# Patient Record
Sex: Male | Born: 1970 | Race: Black or African American | Hispanic: No | Marital: Single | Smoking: Current some day smoker
Health system: Southern US, Community
[De-identification: ages and names within clinical notes are randomized; demographics above are authoritative.]

## PROBLEM LIST (undated history)

## (undated) ENCOUNTER — Emergency Department (HOSPITAL_COMMUNITY): Admission: EM | Payer: Self-pay | Source: Home / Self Care

## (undated) DIAGNOSIS — G8929 Other chronic pain: Secondary | ICD-10-CM

## (undated) DIAGNOSIS — R079 Chest pain, unspecified: Secondary | ICD-10-CM

## (undated) DIAGNOSIS — M79606 Pain in leg, unspecified: Secondary | ICD-10-CM

## (undated) DIAGNOSIS — R002 Palpitations: Secondary | ICD-10-CM

## (undated) DIAGNOSIS — M549 Dorsalgia, unspecified: Secondary | ICD-10-CM

## (undated) DIAGNOSIS — F419 Anxiety disorder, unspecified: Secondary | ICD-10-CM

## (undated) HISTORY — DX: Anxiety disorder, unspecified: F41.9

## (undated) HISTORY — DX: Chest pain, unspecified: R07.9

## (undated) HISTORY — DX: Palpitations: R00.2

---

## 2007-02-05 ENCOUNTER — Emergency Department (HOSPITAL_COMMUNITY): Admission: EM | Admit: 2007-02-05 | Discharge: 2007-02-05 | Payer: Self-pay | Admitting: Family Medicine

## 2008-09-21 ENCOUNTER — Emergency Department (HOSPITAL_COMMUNITY): Admission: EM | Admit: 2008-09-21 | Discharge: 2008-09-21 | Payer: Self-pay | Admitting: Family Medicine

## 2008-11-10 ENCOUNTER — Emergency Department (HOSPITAL_COMMUNITY): Admission: EM | Admit: 2008-11-10 | Discharge: 2008-11-10 | Payer: Self-pay | Admitting: Family Medicine

## 2009-06-16 ENCOUNTER — Emergency Department (HOSPITAL_COMMUNITY): Admission: EM | Admit: 2009-06-16 | Discharge: 2009-06-16 | Payer: Self-pay | Admitting: Emergency Medicine

## 2010-10-05 LAB — INFLUENZA A AND B ANTIGEN (CONVERTED LAB): Inflenza A Ag: POSITIVE — AB

## 2011-08-22 ENCOUNTER — Emergency Department (HOSPITAL_COMMUNITY)
Admission: EM | Admit: 2011-08-22 | Discharge: 2011-08-22 | Disposition: A | Discharge status: Home / Self Care | Payer: Commercial Indemnity | Attending: Emergency Medicine | Admitting: Emergency Medicine

## 2011-08-22 ENCOUNTER — Encounter (HOSPITAL_COMMUNITY): Payer: Self-pay | Admitting: Emergency Medicine

## 2011-08-22 DIAGNOSIS — F17200 Nicotine dependence, unspecified, uncomplicated: Secondary | ICD-10-CM | POA: Insufficient documentation

## 2011-08-22 DIAGNOSIS — B349 Viral infection, unspecified: Secondary | ICD-10-CM

## 2011-08-22 DIAGNOSIS — B9789 Other viral agents as the cause of diseases classified elsewhere: Secondary | ICD-10-CM | POA: Insufficient documentation

## 2011-08-22 LAB — URINALYSIS, ROUTINE W REFLEX MICROSCOPIC
Glucose, UA: NEGATIVE mg/dL
Hgb urine dipstick: NEGATIVE
Protein, ur: 30 mg/dL — AB
Urobilinogen, UA: 1 mg/dL (ref 0.0–1.0)

## 2011-08-22 LAB — URINE MICROSCOPIC-ADD ON

## 2011-08-22 NOTE — ED Notes (Signed)
Patient complaining chills and diaphoresis since yesterday evening; patient denies any shortness of breath, dizziness, weakness, chest pain, nausea/vomiting, and recent cold symptoms.  Patient denies fever.

## 2011-08-22 NOTE — ED Provider Notes (Signed)
Medical screening examination/treatment/procedure(s) were performed by non-physician practitioner and as supervising physician I was immediately available for consultation/collaboration.   Glynn Octave, MD 08/22/11 [phone]

## 2011-08-22 NOTE — ED Provider Notes (Signed)
History     CSN: [id number]  Arrival date & time 08/22/11  1928   First MD Initiated Contact with Patient 08/22/11 2110      Chief Complaint  Patient presents with  . Chills    (Consider location/radiation/quality/duration/timing/severity/associated sxs/prior treatment) HPI Comments: Patient very vague stating, that he has diaphoresis, and chills.  Started yesterday without any other complaints.  No nausea, vomiting, diarrhea, sore throat, runny nose, ear pain, seasonal allergies, urinary tract symptoms, penile discharge.  He, states he would took [facility] powders, and felt better  The history is provided by the patient.    History reviewed. No pertinent past medical history.  History reviewed. No pertinent past surgical history.  History reviewed. No pertinent family history.  History  Substance Use Topics  . Smoking status: Current Everyday Smoker  . Smokeless tobacco: Not on file  . Alcohol Use: Yes      Review of Systems  Constitutional: Positive for chills. Negative for fever and appetite change.  HENT: Negative for ear pain, congestion, sore throat, rhinorrhea and trouble swallowing.   Respiratory: Negative for cough and shortness of breath.   Cardiovascular: Negative for chest pain.  Gastrointestinal: Negative for nausea, vomiting, abdominal pain, diarrhea and constipation.  Genitourinary: Negative for dysuria, frequency, flank pain and discharge.  Neurological: Negative for dizziness, weakness and headaches.    Allergies  Review of patient's allergies indicates no known allergies.  Home Medications   Current Outpatient Rx  Name Route Sig Dispense Refill  . BC HEADACHE POWDER PO Oral Take 1 packet by mouth as needed. For pain or fever      BP 131/74  Pulse 65  Temp 99.2 F (37.3 C) (Oral)  Resp 16  SpO2 98%  Physical Exam  Constitutional: He is oriented to person, place, and time. He appears well-developed and well-nourished.  HENT:  Head:  Normocephalic.  Right Ear: External ear normal.  Left Ear: External ear normal.  Nose: Nose normal.  Mouth/Throat: Oropharynx is clear and moist.  Eyes: Pupils are equal, round, and reactive to light.  Cardiovascular: Normal rate.   Pulmonary/Chest: Effort normal. He has no wheezes. He exhibits no tenderness.  Abdominal: Soft. He exhibits no distension. There is no tenderness.  Musculoskeletal: Normal range of motion.  Neurological: He is alert and oriented to person, place, and time.  Skin: Skin is warm. No rash noted. No pallor.    ED Course  Procedures (including critical care time)  Labs Reviewed  URINALYSIS, ROUTINE W REFLEX MICROSCOPIC - Abnormal; Notable for the following:    Color, Urine AMBER (*)  BIOCHEMICALS MAY BE AFFECTED BY COLOR   APPearance CLOUDY (*)     Bilirubin Urine SMALL (*)     Ketones, ur 15 (*)     Protein, ur 30 (*)     All other components within normal limits  URINE MICROSCOPIC-ADD ON   No results found.   1. Viral disease       MDM   Urine results reviewed.  There isindication of infection.  Patient's symptoms are consistent with a viral syndrome, and encouraged him to take over-the-counter Tylenol, ibuprofen, drink plenty of fluids and        Arman Filter, NP 08/22/11 2300

## 2011-12-30 ENCOUNTER — Ambulatory Visit (INDEPENDENT_AMBULATORY_CARE_PROVIDER_SITE_OTHER): Payer: Managed Care, Other (non HMO) | Admitting: Internal Medicine

## 2011-12-30 VITALS — BP 110/78 | HR 71 | Temp 99.1°F | Resp 18 | Ht 73.0 in | Wt 203.0 lb

## 2011-12-30 DIAGNOSIS — R509 Fever, unspecified: Secondary | ICD-10-CM

## 2011-12-30 LAB — POCT CBC
HCT, POC: 50.3 % (ref 43.5–53.7)
Lymph, poc: 1.3 (ref 0.6–3.4)
MCH, POC: 29 pg (ref 27–31.2)
MCHC: 30.6 g/dL — AB (ref 31.8–35.4)
POC Granulocyte: 1.7 — AB (ref 2–6.9)
POC LYMPH PERCENT: 35.1 %L (ref 10–50)
POC MID %: 17.3 %M — AB (ref 0–12)
RDW, POC: 13.6 %
WBC: 3.6 10*3/uL — AB (ref 4.6–10.2)

## 2011-12-30 NOTE — Patient Instructions (Addendum)
Fluids/advance diet/tylenol for fever

## 2011-12-30 NOTE — Progress Notes (Signed)
  Subjective:    Patient ID: Andrew Mcbride, male    DOB: 25-Feb-1970, 41 y.o.   MRN: [id number]  HPI three-day history of headache body aches cough and congestion. Fever as high as 102.3 No nausea vomiting or diarrhea. No flu shot  Past medical history no illnesses no medications Smoker Review of Systems     Objective:   Physical Exam Vital signs stable with T. of 99.1 HEENT is clear No anterior cervical nodes Lungs clear to auscultation Abdomen benign No rash   Results for orders placed in visit on 12/30/11  POCT CBC      Component Value Range   WBC 3.6 (*) 4.6 - 10.2 K/uL   Lymph, poc 1.3  0.6 - 3.4   POC LYMPH PERCENT 35.1  10 - 50 %L   MID (cbc) 0.6  0 - 0.9   POC MID % 17.3 (*) 0 - 12 %M   POC Granulocyte 1.7 (*) 2 - 6.9   Granulocyte percent 47.6  37 - 80 %G   RBC 5.31  4.69 - 6.13 M/uL   Hemoglobin 15.4  14.1 - 18.1 g/dL   HCT, POC 78.2  95.6 - 53.7 %   MCV 94.8  80 - 97 fL   MCH, POC 29.0  27 - 31.2 pg   MCHC 30.6 (*) 31.8 - 35.4 g/dL   RDW, POC 21.3     Platelet Count, POC 146  142 - 424 K/uL   MPV 10.0  0 - 99.8 fL       Assessment & Plan:  Problem #1 viral syndrome probably influenza  Fluids Tylenol and bed rest Out of work until fever resolved

## 2012-08-26 ENCOUNTER — Ambulatory Visit (INDEPENDENT_AMBULATORY_CARE_PROVIDER_SITE_OTHER): Payer: Managed Care, Other (non HMO) | Admitting: Physician Assistant

## 2012-08-26 DIAGNOSIS — T6391XA Toxic effect of contact with unspecified venomous animal, accidental (unintentional), initial encounter: Secondary | ICD-10-CM

## 2012-08-26 MED ORDER — PREDNISONE 20 MG PO TABS: ORAL_TABLET | ORAL | Status: DC

## 2012-08-26 MED ORDER — METHYLPREDNISOLONE SODIUM SUCC 125 MG IJ SOLR
125.0000 mg | Freq: Once | INTRAMUSCULAR | Status: AC
  Administered 2012-08-26: 125 mg via INTRAMUSCULAR

## 2012-08-26 NOTE — Patient Instructions (Addendum)
Start prednisone taper tomorrow morning Also recommend Zyrtec (cetirizine) daily in the morning and Benadryl 25-50 mg at bedtime Ice for 20 minutes 2-3 times daily and elevate as much as possible.  Return in 48 hours if not significantly improved, sooner if worse     Insect Sting Allergy An insect sting can cause pain, redness, and itching at the sting site. Symptoms of an allergic reaction are usually contained in the area of the sting site (localized). An allergic reaction usually occurs within minutes of an insect sting. Redness and swelling of the sting site may last as long as 1 week. SYMPTOMS   A local reaction at the sting site can cause:  Pain.  Redness.  Itching.  Swelling.  A systemic reaction can cause a reaction anywhere on your body. For example, you may develop the following:  Hives.  Generalized swelling.  Body aches.  Itching.  Dizziness.  Nausea or vomiting.  A more serious (anaphylactic) reaction can involve:  Difficulty breathing or wheezing.  Tongue or throat swelling.  Fainting. HOME CARE INSTRUCTIONS   If you are stung, look to see if the stinger is still in the skin. This can appear as a small, black dot at the sting site. The stinger can be removed by scraping it with a dull object such as a credit card or your fingernail. Do not use tweezers. Tweezers can squeeze the stinger and release more insect venom into the skin.  After the stinger has been removed, wash the sting site with soap and water or rubbing alcohol.  Put ice on the sting area.  Put ice in a plastic bag.  Place a towel between your skin and the bag.  Leave the ice on for 15-20 minutes, 3-4 times a day.  You can use a topical anti-itch cream, such as hydrocortisone cream, to help reduce itching.  You can take an oral antihistamine medicine to help decrease swelling and other symptoms.  Only take over-the-counter or prescription medicines for pain, discomfort, or fever  as directed by your caregiver.  If prescribed, keep an epinephrine injection to temporarily treat emergency allergic reactions with you at all times. It is important to know how and when to give an epinephrine injection.  Avoid contact with stinging insects or the insect thought to have caused your reaction.  Wear long pants when mowing grass or hiking. Wear gloves when gardening.  Use unscented deodorant and avoid strong perfumes when outdoors.  Wear a medical alert bracelet or necklace that describes your allergies.  Make sure your primary caregiver has a record of your insect sting reaction.  It may be helpful to consult with an allergy specialist. You may have other sensitivities that you are not aware of. SEEK IMMEDIATE MEDICAL CARE IF:  You experience wheezing or difficulty breathing.  You have difficulty swallowing, or you develop throat tightness.  You have mouth, tongue, or throat swelling.  You feel weak, or you faint.  You have coughing or a change in your voice.  You experience vomiting, diarrhea, or stomach cramps.  You have chest pain or lightheadedness.  You notice raised, red patches on the skin that itch. These may be early warning signs of a serious generalized or anaphylactic reaction. Call your local emergency services (911 in [place] immediately. MAKE SURE YOU:   Understand these instructions.  Will watch your condition.  Will get help right away if you are not doing well or get worse. FOR MORE INFORMATION American Academy of Allergy Asthma  and Immunology: www.aaaai.org [employer], [employer]: www.acaai.org Document Released: 11/29/2005 Document Revised: 03/25/2011 Document Reviewed: 01/10/2009 [facility] Patient Information 2014 [place], [place].

## 2012-08-26 NOTE — Progress Notes (Signed)
  Subjective:    Patient ID: Andrew Mcbride, male    DOB: 1970-09-17, 42 y.o.   MRN: [id number]  HPI 42 year old male presents with 3 day history of left arm swelling and pruritis s/p insect sting on 08/24/12 while at work. He works for the [employer] and was opening a trash bin and bees flew out stinging him on his left arm.  Admits he felt immediate pain and noticed a bit of local swelling.  Since then it has become increasingly swollen and irritated. States it is extremely pruritic.  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 Admits this did not seem to help at all.  Denies SOB, trouble breathing, lip/tongue swelling.  He has not tried any other OTC medications. Denies purulent drainage, fever, chills, nausea, vomiting, or headache. Otherwise is doing well with no other concerns today     Review of Systems  Constitutional: Negative for fever and chills.  Gastrointestinal: Negative for nausea and vomiting.  Skin: Positive for rash (swelling of left forearm).  Neurological: Negative for headaches.       Objective:   Physical Exam  Constitutional: He is oriented to person, place, and time. He appears well-developed and well-nourished.  HENT:  Head: Normocephalic and atraumatic.  Right Ear: External ear normal.  Left Ear: External ear normal.  Eyes: Conjunctivae are normal.  Neck: Normal range of motion.  Cardiovascular: Normal rate.   Pulmonary/Chest: Effort normal.  Neurological: He is alert and oriented to person, place, and time.  Skin:     Psychiatric: He has a normal mood and affect. His behavior is normal. Judgment and thought content normal.          Assessment & Plan:  Bee sting reaction, initial encounter - Plan: methylPREDNISolone sodium succinate (SOLU-MEDROL) 125 mg/2 mL injection 125 mg  Solumedrol 125 mg given today Start prednisone taper tomorrow Zyrtec daily in the morning, Benadryl at bedtime Ice for 20 minutes 2-3  times a day and elevate as much as possible Out of work today and tomorrow Follow up if symptoms do not improve in 48 hours, sooner if worse

## 2013-10-04 ENCOUNTER — Ambulatory Visit (INDEPENDENT_AMBULATORY_CARE_PROVIDER_SITE_OTHER): Payer: Managed Care, Other (non HMO) | Admitting: Internal Medicine

## 2013-10-04 VITALS — BP 108/64 | HR 68 | Temp 98.2°F | Resp 16 | Ht 70.5 in | Wt 206.0 lb

## 2013-10-04 DIAGNOSIS — M79672 Pain in left foot: Secondary | ICD-10-CM

## 2013-10-04 DIAGNOSIS — M79605 Pain in left leg: Secondary | ICD-10-CM

## 2013-10-04 DIAGNOSIS — B070 Plantar wart: Secondary | ICD-10-CM

## 2013-10-04 DIAGNOSIS — M79609 Pain in unspecified limb: Secondary | ICD-10-CM

## 2013-10-04 DIAGNOSIS — G8929 Other chronic pain: Secondary | ICD-10-CM

## 2013-10-04 MED ORDER — MELOXICAM 15 MG PO TABS: 15.0000 mg | ORAL_TABLET | Freq: Every day | ORAL | Status: DC

## 2013-10-04 NOTE — Progress Notes (Signed)
Subjective:  This chart was scribed for Ellamae Sia, MD by Elveria Rising, Medial Scribe. This patient was seen in room 8 and the patient's care was started at 6:36 PM.    Patient ID: Andrew Mcbride, male    DOB: 1970-06-21, 43 y.o.   MRN: [id number]  Chief Complaint  Patient presents with  . Foot Pain    left, radiates up leg    HPI HPI Comments: Andrew Mcbride is a 43 y.o. male who presents to the [facility] complaining of pain in his left lower leg, for quite a while. Patient went to the podiatrist today and had several plantar, calcaneal warts removed. Patient describes burning sensation in his left leg and calf tightness, but denies numbness and tingling. Patient has been walking on his tip toes to prevent bearing weight on his heel and irritating his warts. Patient reports exacerbation with ambulation and reports aching pain at night even when he is at rest. 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 He denies knee involvement or associated back pain. Patient reports history of gout, diagnosed several years ago, but he denies redness or pain with applied pressure to his lower leg.   There are no active problems to display for this patient.  History reviewed. No pertinent past medical history. History reviewed. No pertinent past surgical history. No Known Allergies Prior to Admission medications   Medication Sig Start Date End Date Taking? Authorizing Provider  Aspirin-Salicylamide-Caffeine (BC HEADACHE POWDER PO) Take 1 packet by mouth as needed. For pain or fever   Yes Historical Provider, MD   History   Social History  . Marital Status: Single    Spouse Name: N/A    Number of Children: N/A  . Years of Education: N/A   Occupational History  . Not on file.   Social History Main Topics  . Smoking status: Current Every Day Smoker  . Smokeless tobacco: Not on file  . Alcohol Use: Yes  . Drug Use: No  . Sexual Activity:    Other  Topics Concern  . Not on file   Social History Narrative  . No narrative on file     Review of Systems  Constitutional: Negative for fever and chills.  Musculoskeletal: Positive for arthralgias. Negative for back pain, gait problem and joint swelling.  Neurological: Negative for weakness and numbness.       Objective:   Physical Exam  Nursing note and vitals reviewed. Constitutional: He is oriented to person, place, and time. He appears well-developed and well-nourished. No distress.  HENT:  Head: Normocephalic and atraumatic.  Eyes: EOM are normal.  Neck: Neck supple.  Cardiovascular: Normal rate.   Pulmonary/Chest: Effort normal. No respiratory distress.  Musculoskeletal: Normal range of motion. He exhibits tenderness.  Left leg: has no swelling or tenderness to palpation or ROM except for the distal part of the calf. No defect or mass. The left heel has multiple warts which have been operated on today by podiatry.   Neurological: He is alert and oriented to person, place, and time.  Skin: Skin is warm and dry.  Psychiatric: He has a normal mood and affect. His behavior is normal.     Filed Vitals:   10/04/13 1803  BP: 108/64  Pulse: 68  Temp: 98.2 F (36.8 C)  Resp: 16  Height: 5' 10.5" (1.791 m)  Weight: 206 lb (93.441 kg)  SpO2: 99%        Assessment & Plan:  Pain of left leg-gastroc str due to altered gait  Pain, foot, chronic, left due to Plantar warts/surgery today  mobic Heel lift molesk  I personally performed the services described in this documentation, which was scribed in my presence. The recorded information has been reviewed and is accurate.

## 2014-01-23 ENCOUNTER — Emergency Department (HOSPITAL_COMMUNITY)
Admission: EM | Admit: 2014-01-23 | Discharge: 2014-01-23 | Disposition: A | Discharge status: Home / Self Care | Payer: Commercial Indemnity | Attending: Emergency Medicine | Admitting: Emergency Medicine

## 2014-01-23 ENCOUNTER — Encounter (HOSPITAL_COMMUNITY): Payer: Self-pay | Admitting: Emergency Medicine

## 2014-01-23 DIAGNOSIS — Y9389 Activity, other specified: Secondary | ICD-10-CM | POA: Insufficient documentation

## 2014-01-23 DIAGNOSIS — Y998 Other external cause status: Secondary | ICD-10-CM | POA: Insufficient documentation

## 2014-01-23 DIAGNOSIS — Z720 Tobacco use: Secondary | ICD-10-CM | POA: Insufficient documentation

## 2014-01-23 DIAGNOSIS — Y9289 Other specified places as the place of occurrence of the external cause: Secondary | ICD-10-CM | POA: Diagnosis not present

## 2014-01-23 DIAGNOSIS — X58XXXA Exposure to other specified factors, initial encounter: Secondary | ICD-10-CM | POA: Insufficient documentation

## 2014-01-23 DIAGNOSIS — Z791 Long term (current) use of non-steroidal anti-inflammatories (NSAID): Secondary | ICD-10-CM | POA: Diagnosis not present

## 2014-01-23 DIAGNOSIS — S3992XA Unspecified injury of lower back, initial encounter: Secondary | ICD-10-CM | POA: Diagnosis present

## 2014-01-23 DIAGNOSIS — S39002A Unspecified injury of muscle, fascia and tendon of lower back, initial encounter: Secondary | ICD-10-CM | POA: Diagnosis not present

## 2014-01-23 DIAGNOSIS — T148XXA Other injury of unspecified body region, initial encounter: Secondary | ICD-10-CM

## 2014-01-23 MED ORDER — METHOCARBAMOL 750 MG PO TABS: 750.0000 mg | ORAL_TABLET | Freq: Four times a day (QID) | ORAL | Status: DC

## 2014-01-23 MED ORDER — IBUPROFEN 600 MG PO TABS: 600.0000 mg | ORAL_TABLET | Freq: Four times a day (QID) | ORAL | Status: DC | PRN

## 2014-01-23 NOTE — ED Notes (Signed)
Patient here with complaint of lower back pain. States that it started about 1 hour ago. Denies history of the same. 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 Concerned about back injury.

## 2014-01-23 NOTE — Discharge Instructions (Signed)

## 2014-01-23 NOTE — ED Provider Notes (Signed)
CSN: [id number]     Arrival date & time 01/23/14  0223 History   First MD Initiated Contact with Patient 01/23/14 0413     Chief Complaint  Patient presents with  . Back Pain     (Consider location/radiation/quality/duration/timing/severity/associated sxs/prior Treatment) HPI Comments: Patient here complains of 2 months of left-sided back pain is worse with movement and characterized as sharp. Pain does not radiate to his leg. No associated bowel or bladder dysfunction. No saddle anesthesias. Denies any rashes to the area. Symptoms are better with remaining still. No trouble walking. Does do heavy lifting. No treatment use prior to arrival  Patient is a 44 y.o. male presenting with back pain. The history is provided by the patient.  Back Pain   History reviewed. No pertinent past medical history. History reviewed. No pertinent past surgical history. No family history on file. History  Substance Use Topics  . Smoking status: Current Every Day Smoker  . Smokeless tobacco: Not on file  . Alcohol Use: Yes    Review of Systems  Musculoskeletal: Positive for back pain.  All other systems reviewed and are negative.     Allergies  Review of patient's allergies indicates no known allergies.  Home Medications   Prior to Admission medications   Medication Sig Start Date End Date Taking? Authorizing Provider  Aspirin-Salicylamide-Caffeine (BC HEADACHE POWDER PO) Take 1 packet by mouth as needed. For pain or fever    Historical Provider, MD  meloxicam (MOBIC) 15 MG tablet Take 1 tablet (15 mg total) by mouth daily. 10/04/13   Tonye Pearson, MD   BP 137/77 mmHg  Pulse 98  Temp(Src) 98.4 F (36.9 C) (Oral)  Resp 18  Ht 6' (1.829 m)  Wt 205 lb (92.987 kg)  BMI 27.80 kg/m2  SpO2 98% Physical Exam  Constitutional: He is oriented to person, place, and time. He appears well-developed and well-nourished.  Non-toxic appearance. No distress.  HENT:  Head: Normocephalic and  atraumatic.  Eyes: Conjunctivae, EOM and lids are normal. Pupils are equal, round, and reactive to light.  Neck: Normal range of motion. Neck supple. No tracheal deviation present. No thyroid mass present.  Cardiovascular: Normal rate, regular rhythm and normal heart sounds.  Exam reveals no gallop.   No murmur heard. Pulmonary/Chest: Effort normal and breath sounds normal. No stridor. No respiratory distress. He has no decreased breath sounds. He has no wheezes. He has no rhonchi. He has no rales.  Abdominal: Soft. Normal appearance and bowel sounds are normal. He exhibits no distension. There is no tenderness. There is no rebound and no CVA tenderness.  Musculoskeletal: Normal range of motion. He exhibits no edema or tenderness.       Back:  Neurological: He is alert and oriented to person, place, and time. He has normal strength. No cranial nerve deficit or sensory deficit. GCS eye subscore is 4. GCS verbal subscore is 5. GCS motor subscore is 6.  Skin: Skin is warm and dry. No abrasion and no rash noted.  Psychiatric: He has a normal mood and affect. His speech is normal and behavior is normal.  Nursing note and vitals reviewed.   ED Course  Procedures (including critical care time) Labs Review Labs Reviewed - No data to display  Imaging Review No results found.   EKG Interpretation None      MDM   Final diagnoses:  None    Patient nontender along his thoracic and lumbar spine. Suspect musculoskeletal etiology of symptoms. Will place  on medications and stable for discharge.    Toy BakerAnthony T Verlan Grotz, MD 01/23/14 [phone]

## 2014-01-23 NOTE — ED Notes (Signed)
Pt. Left with al belongings and refused wheelchair

## 2014-01-27 ENCOUNTER — Encounter (HOSPITAL_COMMUNITY): Payer: Self-pay | Admitting: Emergency Medicine

## 2014-01-27 ENCOUNTER — Emergency Department (HOSPITAL_COMMUNITY): Payer: Commercial Indemnity

## 2014-01-27 ENCOUNTER — Emergency Department (HOSPITAL_COMMUNITY)
Admission: EM | Admit: 2014-01-27 | Discharge: 2014-01-27 | Disposition: A | Discharge status: Home / Self Care | Payer: Commercial Indemnity | Attending: Emergency Medicine | Admitting: Emergency Medicine

## 2014-01-27 DIAGNOSIS — R251 Tremor, unspecified: Secondary | ICD-10-CM | POA: Diagnosis not present

## 2014-01-27 DIAGNOSIS — Z720 Tobacco use: Secondary | ICD-10-CM | POA: Diagnosis not present

## 2014-01-27 DIAGNOSIS — Z791 Long term (current) use of non-steroidal anti-inflammatories (NSAID): Secondary | ICD-10-CM | POA: Diagnosis not present

## 2014-01-27 DIAGNOSIS — Z79899 Other long term (current) drug therapy: Secondary | ICD-10-CM | POA: Insufficient documentation

## 2014-01-27 DIAGNOSIS — R52 Pain, unspecified: Secondary | ICD-10-CM

## 2014-01-27 DIAGNOSIS — M545 Low back pain, unspecified: Secondary | ICD-10-CM

## 2014-01-27 DIAGNOSIS — R079 Chest pain, unspecified: Secondary | ICD-10-CM | POA: Diagnosis not present

## 2014-01-27 LAB — CBC
HCT: 46.1 % (ref 39.0–52.0)
Hemoglobin: 15.8 g/dL (ref 13.0–17.0)
MCH: 30.8 pg (ref 26.0–34.0)
MCHC: 34.3 g/dL (ref 30.0–36.0)
MCV: 89.9 fL (ref 78.0–100.0)
Platelets: 192 10*3/uL (ref 150–400)
RBC: 5.13 MIL/uL (ref 4.22–5.81)
RDW: 12.7 % (ref 11.5–15.5)
WBC: 7.5 10*3/uL (ref 4.0–10.5)

## 2014-01-27 LAB — BASIC METABOLIC PANEL
Anion gap: 16 — ABNORMAL HIGH (ref 5–15)
BUN: 17 mg/dL (ref 6–23)
CO2: 26 mmol/L (ref 19–32)
Calcium: 9.6 mg/dL (ref 8.4–10.5)
Chloride: 100 mEq/L (ref 96–112)
Creatinine, Ser: 1.29 mg/dL (ref 0.50–1.35)
GFR calc Af Amer: 78 mL/min — ABNORMAL LOW (ref >90)
GFR calc non Af Amer: 67 mL/min — ABNORMAL LOW (ref >90)
Glucose, Bld: 139 mg/dL — ABNORMAL HIGH (ref 70–99)
Potassium: 3.5 mmol/L (ref 3.5–5.1)
Sodium: 142 mmol/L (ref 135–145)

## 2014-01-27 LAB — I-STAT TROPONIN, ED: TROPONIN I, POC: 0 ng/mL (ref 0.00–0.08)

## 2014-01-27 LAB — BRAIN NATRIURETIC PEPTIDE: B Natriuretic Peptide: 24.8 pg/mL (ref 0.0–100.0)

## 2014-01-27 MED ORDER — HYDROCODONE-ACETAMINOPHEN 5-325 MG PO TABS: 1.0000 | ORAL_TABLET | ORAL | Status: DC | PRN

## 2014-01-27 NOTE — Discharge Instructions (Signed)
Do not drive or work when using the pain medication. Try using heat on the sore area 3 or 4 times a day.    Back Pain, Adult Low back pain is very common. About 1 in 5 people have back pain.The cause of low back pain is rarely dangerous. The pain often gets better over time.About half of people with a sudden onset of back pain feel better in just 2 weeks. About 8 in 10 people feel better by 6 weeks.  CAUSES Some common causes of back pain include:  Strain of the muscles or ligaments supporting the spine.  Wear and tear (degeneration) of the spinal discs.  Arthritis.  Direct injury to the back. DIAGNOSIS Most of the time, the direct cause of low back pain is not known.However, back pain can be treated effectively even when the exact cause of the pain is unknown.Answering your caregiver's questions about your overall health and symptoms is one of the most accurate ways to make sure the cause of your pain is not dangerous. If your caregiver needs more information, he or she may order lab work or imaging tests (X-rays or MRIs).However, even if imaging tests show changes in your back, this usually does not require surgery. HOME CARE INSTRUCTIONS For many people, back pain returns.Since low back pain is rarely dangerous, it is often a condition that people can learn to [facility] their own.   Remain active. It is stressful on the back to sit or stand in one place. Do not sit, drive, or stand in one place for more than 30 minutes at a time. Take short walks on level surfaces as soon as pain allows.Try to increase the length of time you walk each day.  Do not stay in bed.Resting more than 1 or 2 days can delay your recovery.  Do not avoid exercise or work.Your body is made to move.It is not dangerous to be active, even though your back may hurt.Your back will likely heal faster if you return to being active before your pain is gone.  Pay attention to your body when you bend and lift.  Many people have less discomfortwhen lifting if they bend their knees, keep the load close to their bodies,and avoid twisting. Often, the most comfortable positions are those that put less stress on your recovering back.  Find a comfortable position to sleep. Use a firm mattress and lie on your side with your knees slightly bent. If you lie on your back, put a pillow under your knees.  Only take over-the-counter or prescription medicines as directed by your caregiver. Over-the-counter medicines to reduce pain and inflammation are often the most helpful.Your caregiver may prescribe muscle relaxant drugs.These medicines help dull your pain so you can more quickly return to your normal activities and healthy exercise.  Put ice on the injured area.  Put ice in a plastic bag.  Place a towel between your skin and the bag.  Leave the ice on for 15-20 minutes, 03-04 times a day for the first 2 to 3 days. After that, ice and heat may be alternated to reduce pain and spasms.  Ask your caregiver about trying back exercises and gentle massage. This may be of some benefit.  Avoid feeling anxious or stressed.Stress increases muscle tension and can worsen back pain.It is important to recognize when you are anxious or stressed and learn ways to manage it.Exercise is a great option. SEEK MEDICAL CARE IF:  You have pain that is not relieved with rest  or medicine.  You have pain that does not improve in 1 week.  You have new symptoms.  You are generally not feeling well. SEEK IMMEDIATE MEDICAL CARE IF:   You have pain that radiates from your back into your legs.  You develop new bowel or bladder control problems.  You have unusual weakness or numbness in your arms or legs.  You develop nausea or vomiting.  You develop abdominal pain.  You feel faint. Document Released: 12/31/2004 Document Revised: 07/02/2011 Document Reviewed: 05/04/2013 [facility] Patient Information 2015 [place].  This information is not intended to replace advice given to you by your health care provider. Make sure you discuss any questions you have with your health care provider.

## 2014-01-27 NOTE — ED Notes (Signed)
Pt. reports left lateral chest pain with mild SOB and chills onset this evening , no nausea or diaphoresis .

## 2014-01-27 NOTE — ED Provider Notes (Signed)
CSN: [id number]     Arrival date & time 01/27/14  0037 History  This chart was scribed for [name], MD by Luisa DagoPriscilla Tutu, ED Scribe. This patient was seen in room A01C/A01C and the patient's care was started at 2:01 AM.    Chief Complaint  Patient presents with  . Chest Pain    The history is provided by the patient and medical records. No language interpreter was used.   HPI Comments: Andrew Mcbride is a 44 y.o. male who presents to the Emergency Department complaining of gradual onset radiating worsening left sided back pain that started approximately 2 months ago. Pt was treated here in the ED for his back pain but he states that the medication has not proven effective in alleviating the back pain. He states hat the pain that he experienced today radiated up to his left chest. Although he complains of associated chest pain he is more concerned of the left sided back pain which he describes it being localized over the left iliac crest. He also admits to having tremors. Pt states that the pain is worsened by standing and certain body movements. Denies any recent falls, traumas, fever, chills, illicit drug use, nausea, emesis, or weakness.  History reviewed. No pertinent past medical history. History reviewed. No pertinent past surgical history. No family history on file. History  Substance Use Topics  . Smoking status: Current Every Day Smoker  . Smokeless tobacco: Not on file  . Alcohol Use: Yes    Review of Systems  Constitutional: Negative for fever and chills.  Respiratory: Negative for cough.   Cardiovascular: Positive for chest pain.  Gastrointestinal: Negative for nausea, vomiting and abdominal pain.  Musculoskeletal: Positive for back pain.  Neurological: Positive for tremors. Negative for weakness and numbness.  All other systems reviewed and are negative.     Allergies  Review of patient's allergies indicates no known allergies.  Home Medications   Prior to  Admission medications   Medication Sig Start Date End Date Taking? Authorizing Provider  Aspirin-Salicylamide-Caffeine (BC HEADACHE POWDER PO) Take 1 packet by mouth as needed. For pain or fever    Historical Provider, MD  HYDROcodone-acetaminophen (NORCO) 5-325 MG per tablet Take 1 tablet by mouth every 4 (four) hours as needed. 01/27/14   Flint MelterElliott L Sneijder Bernards, MD  ibuprofen (ADVIL,MOTRIN) 600 MG tablet Take 1 tablet (600 mg total) by mouth every 6 (six) hours as needed. 01/23/14   Toy BakerAnthony T Allen, MD  meloxicam (MOBIC) 15 MG tablet Take 1 tablet (15 mg total) by mouth daily. 10/04/13   Tonye Pearsonobert P Doolittle, MD  methocarbamol (ROBAXIN-750) 750 MG tablet Take 1 tablet (750 mg total) by mouth 4 (four) times daily. 01/23/14   Toy BakerAnthony T Allen, MD   BP 150/73 mmHg  Pulse 64  Temp(Src) 98.3 F (36.8 C) (Oral)  Resp 13  Ht 5\' 11"  (1.803 m)  Wt 202 lb (91.627 kg)  BMI 28.19 kg/m2  SpO2 99%  Physical Exam  Constitutional: He is oriented to person, place, and time. He appears well-developed and well-nourished.  HENT:  Head: Normocephalic and atraumatic.  Right Ear: External ear normal.  Left Ear: External ear normal.  Eyes: Conjunctivae and EOM are normal. Pupils are equal, round, and reactive to light.  Neck: Normal range of motion and phonation normal. Neck supple.  Cardiovascular: Normal rate, regular rhythm and normal heart sounds.   Pulmonary/Chest: Effort normal and breath sounds normal. He exhibits no bony tenderness.  Abdominal: Soft. There is  no tenderness.  Musculoskeletal: Normal range of motion.  Tenderness to palpation of the left iliac crest. Negative straight leg raises bilaterally. Normal ROM of all 4 extremities.  Neurological: He is alert and oriented to person, place, and time. No cranial nerve deficit or sensory deficit. He exhibits normal muscle tone. Coordination normal.  Skin: Skin is warm, dry and intact.  Psychiatric: He has a normal mood and affect. His behavior is normal.  Judgment and thought content normal.  Nursing note and vitals reviewed.   ED Course  Procedures (including critical care time)  DIAGNOSTIC STUDIES: Oxygen Saturation is 99% on RA, normal by my interpretation.    COORDINATION OF CARE: Medications - No data to display  No data found.  2:05 AM- Pt advised of plan for treatment and pt agrees.  At discharge- patient is much more comfortable.  Findings discussed with patient, all questions answered.  Labs Review Labs Reviewed  BASIC METABOLIC PANEL - Abnormal; Notable for the following:    Glucose, Bld 139 (*)    GFR calc non Af Amer 67 (*)    GFR calc Af Amer 78 (*)    Anion gap 16 (*)    All other components within normal limits  CBC  BRAIN NATRIURETIC PEPTIDE  I-STAT TROPOININ, ED    Imaging Review Dg Chest 2 View  01/27/2014   CLINICAL DATA:  Chest pain.  EXAM: CHEST  2 VIEW  COMPARISON:  None.  FINDINGS: The cardiomediastinal contours are normal. The lungs are clear. Pulmonary vasculature is normal. No consolidation, pleural effusion, or pneumothorax. No acute osseous abnormalities are seen.  IMPRESSION: No acute pulmonary process.   Electronically Signed   By: Rubye Oaks M.D.   On: 01/27/2014 02:07   Dg Pelvis 1-2 Views  01/27/2014   CLINICAL DATA:  Acute onset of left hip pain.  Initial encounter.  EXAM: PELVIS - 1-2 VIEW  COMPARISON:  None.  FINDINGS: There is no evidence of fracture or dislocation. Both femoral heads are seated normally within their respective acetabula. No significant degenerative change is appreciated. A small bone island is noted at the lateral aspect of the left femoral head. The sacroiliac joints are unremarkable in appearance.  The visualized bowel gas pattern is grossly unremarkable in appearance.  IMPRESSION: No evidence of fracture or dislocation.   Electronically Signed   By: Roanna Raider M.D.   On: 01/27/2014 02:47     EKG Interpretation   Date/Time:  Thursday January 27 2014 00:42:10  EST Ventricular Rate:  101 PR Interval:  176 QRS Duration: 82 QT Interval:  328 QTC Calculation: 425 R Axis:   78 Text Interpretation:  Sinus tachycardia Otherwise normal ECG Since last  tracing rate faster Confirmed by [facility]  MD, Verdia Bolt [phone]) on 01/27/2014  2:03:10 AM      MDM   Final diagnoses:  Pain  Left-sided low back pain without sciatica    Nonspecific left low back pain localized over the left iliac crest.  Suspect that he has an occult injury to the associated musculature that insert on the iliac crest.  Doubt fracture, lumbar myelopathy or myositis.   Nursing Notes Reviewed/ Care Coordinated Applicable Imaging Reviewed Interpretation of Laboratory Data incorporated into ED treatment  The patient appears reasonably screened and/or stabilized for discharge and I doubt any other medical condition or other [facility] requiring further screening, evaluation, or treatment in the ED at this time prior to discharge.  Plan: Home Medications- OTC analgesia; Home Treatments- Rest, heat;  return here if the recommended treatment, does not improve the symptoms; Recommended follow up- PCP prn   I personally performed the services described in this documentation, which was scribed in my presence. The recorded information has been reviewed and is accurate.     Flint Melter, MD 01/28/14 [phone]

## 2014-01-30 ENCOUNTER — Encounter (HOSPITAL_COMMUNITY): Payer: Self-pay | Admitting: *Deleted

## 2014-01-30 ENCOUNTER — Emergency Department (HOSPITAL_COMMUNITY)
Admission: EM | Admit: 2014-01-30 | Discharge: 2014-01-30 | Disposition: A | Discharge status: Home / Self Care | Payer: Commercial Indemnity | Attending: Emergency Medicine | Admitting: Emergency Medicine

## 2014-01-30 ENCOUNTER — Emergency Department (HOSPITAL_COMMUNITY): Payer: Commercial Indemnity

## 2014-01-30 DIAGNOSIS — Z791 Long term (current) use of non-steroidal anti-inflammatories (NSAID): Secondary | ICD-10-CM | POA: Insufficient documentation

## 2014-01-30 DIAGNOSIS — Z720 Tobacco use: Secondary | ICD-10-CM | POA: Insufficient documentation

## 2014-01-30 DIAGNOSIS — R109 Unspecified abdominal pain: Secondary | ICD-10-CM | POA: Diagnosis not present

## 2014-01-30 DIAGNOSIS — M545 Low back pain, unspecified: Secondary | ICD-10-CM

## 2014-01-30 DIAGNOSIS — G8929 Other chronic pain: Secondary | ICD-10-CM | POA: Insufficient documentation

## 2014-01-30 HISTORY — DX: Other chronic pain: G89.29

## 2014-01-30 HISTORY — DX: Pain in leg, unspecified: M79.606

## 2014-01-30 HISTORY — DX: Dorsalgia, unspecified: M54.9

## 2014-01-30 LAB — URINALYSIS, ROUTINE W REFLEX MICROSCOPIC
BILIRUBIN URINE: NEGATIVE
Glucose, UA: NEGATIVE mg/dL
HGB URINE DIPSTICK: NEGATIVE
KETONES UR: NEGATIVE mg/dL
LEUKOCYTES UA: NEGATIVE
NITRITE: NEGATIVE
PH: 6 (ref 5.0–8.0)
PROTEIN: NEGATIVE mg/dL
SPECIFIC GRAVITY, URINE: 1.02 (ref 1.005–1.030)
UROBILINOGEN UA: 1 mg/dL (ref 0.0–1.0)

## 2014-01-30 MED ORDER — NAPROXEN 250 MG PO TABS: 250.0000 mg | ORAL_TABLET | Freq: Two times a day (BID) | ORAL | Status: DC | PRN

## 2014-01-30 MED ORDER — HYDROCODONE-ACETAMINOPHEN 5-325 MG PO TABS: ORAL_TABLET | ORAL | Status: DC

## 2014-01-30 MED ORDER — METHOCARBAMOL 500 MG PO TABS: 1000.0000 mg | ORAL_TABLET | Freq: Four times a day (QID) | ORAL | Status: DC | PRN

## 2014-01-30 NOTE — ED Notes (Signed)
The pt is c/o rt abd lt abd pain for one week..  He has been seen here x 2 in the past week the last time was Wednesday.  His pain is no better

## 2014-01-30 NOTE — Discharge Instructions (Signed)
°Emergency Department Resource Guide °1) Find a Doctor and Pay Out of Pocket °Although you won't have to find out who is covered by your insurance plan, it is a good idea to ask around and get recommendations. You will then need to call the office and see if the doctor you have chosen will accept you as a new patient and what types of options they offer for patients who are self-pay. Some doctors offer discounts or will set up payment plans for their patients who do not have insurance, but you will need to ask so you aren't surprised when you get to your appointment. ° °2) Contact Your Local Health Department °Not all health departments have doctors that can see patients for sick visits, but many do, so it is worth a call to see if yours does. If you don't know where your local health department is, you can check in your phone book. The CDC also has a tool to help you locate your state's health department, and many state websites also have listings of all of their local health departments. ° °3) Find a Walk-in Clinic °If your illness is not likely to be very severe or complicated, you may want to try a walk in clinic. These are popping up all over the country in pharmacies, drugstores, and shopping centers. They're usually staffed by nurse practitioners or physician assistants that have been trained to treat common illnesses and complaints. They're usually fairly quick and inexpensive. However, if you have serious medical issues or chronic medical problems, these are probably not your best option. ° °No Primary Care Doctor: °- Call [facility] at  832-8000 - they can help you locate a primary care doctor that  accepts your insurance, provides certain services, etc. °- Physician Referral Service- 1-800-533-3463 ° °Chronic Pain Problems: °Organization         Address  Phone   Notes  °[facility]  (336) 297-2271 Patients need to be referred by their primary care doctor.  ° °Medication  Assistance: °Organization         Address  Phone   Notes  °[place] [facility] [address] °[place], [place] [address] (336) 641-8030 --Must be a resident of [place] °-- Must have NO insurance coverage whatsoever (no Medicaid/ Medicare, etc.) °-- The pt. MUST have a primary care doctor that directs their care regularly and follows them in the community °  °[facility]  (866) 331-1348   °[employer]  (888) 892-1162   ° °Agencies that provide inexpensive medical care: °Organization         Address  Phone   Notes  °[facility]  (336) 832-8035   °[facility]    (336) 832-7272   °[facility] [address] °[place], [place] [address] (336) 832-4777   °[facility] [address], °[place] (336) 271-4999   °[facility]    (336) 373-0678   °[facility]    (336) 272-1050   °[facility] ° [address], [place] Phone:  (336) 832-4444, Fax:  (336) 832-4440 Hours of Operation:  9 am - 6 pm, M-F.  Also accepts Medicaid/Medicare and self-pay.  °[facility] ° [address], [place] Phone: (336) 832-3150, Fax: (336) [phone]. Hours of Operation:  8:30 am - 5:30 pm, M-F.  Also accepts Medicaid and self-pay.  °[facility] [place] [address], [place] Phone: (336) 878-6027   °[facility] [address], [place], [place] (336)723-1848, Ext. 123 Mondays & Thursdays: 7-9 AM.  First 15 patients are seen on a first come, first serve basis. °  ° °Medicaid-accepting [place] Providers: ° °Organization         Address  Phone   Notes  °[facility] [address], [place] (336) 641-2100 Also accepts self-pay patients.  °[facility] [address], [place] ° (336) 856-9996   °[facility] [address], [place]  (336) 288-8857   °[facility] [address] [place] [address], [place] (336) 299-7000   °Veita Bland [address], [place]  ° (336) 373-1557 Only accepts [place] Access Medicaid patients after they have their name applied to their card.  ° °Self-Pay (no insurance) in [place]: ° °Organization         Address  Phone   Notes  °Sickle Cell Patients, [facility] [address], [place] (336) 832-1970   °[facility] [address], [place] (336) 832-4400   °[facility] [place] ° [address],  (336) 992-4800   °[facility]/Dr. Osei-Bonsu ° [address] [place] [address], [place] or [address], [place] (336) 841-8500 Phone number for both [place] and [place] locations is the same.  °[facility] [address], [place] (336) 299-0000   °[facility] [address] [place] [address], [place] or [address] (336) 852-7530 °(336) 878-2260   °[facility] [address], [place] (336) 350-1642, phone; (336) 294-5005, fax Sees patients 1st and 3rd Saturday of every month.  Must not qualify for public or private insurance (i.e. Medicaid, Medicare, [place] Health Choice, Veterans' Benefits) • Household income should be no more than 200% of the poverty level •The clinic cannot treat you if you are pregnant or think you are pregnant • Sexually transmitted diseases are not treated at the clinic.  ° ° °Dental Care: °Organization         Address  Phone  Notes  °[place] [facility] [address],  (336) 641-6152 Accepts children up to age 21 who are enrolled in Medicaid or [place] Health Choice; pregnant women with a Medicaid card; and children who have applied for Medicaid or [place] Health Choice, but were declined, whose parents can pay a reduced fee at time of service.  °[place]  [facility] [place]  [address], [place] (336) 641-7733 Accepts children up to age 21 who are enrolled in Medicaid or [place] Health Choice; pregnant women with a Medicaid card; and children who have applied for Medicaid or [place] Health Choice, but were declined, whose parents can pay a reduced fee at time of service.  °[facility] ° [address],  (336) 641-4533 Patients are seen by appointment only. Walk-ins are not accepted. [facility] will see patients 18 years of age and older. °Monday - Tuesday (8am-5pm) °Most Wednesdays (8:30-5pm) °$30 per visit, cash only  °[facility] ° [address], [place] (336) 641-4533 Patients are seen by appointment only. Walk-ins are not accepted. [facility] will see patients 18 years of age and older. °One   Wednesday Evening (Monthly: Volunteer Based).  $30 per visit, cash only  °[facility]  (919) 537-3737 for adults; Children under age 4, call Graduate Pediatric Dentistry at ([phone]) [phone]. Children aged 4-14, please call (919) [phone] to request a pediatric application. ° Dental services are provided in all areas of dental care including fillings, crowns and bridges, complete and partial dentures, implants, gum treatment, root canals, and extractions. Preventive care is also provided. Treatment is provided to both adults and children. °Patients are selected via a lottery and there is often a waiting list. °  °[facility] [address], °[place] ° (336) 763-8833 [email or web address] °  °[facility] [address], [place], [place] (336)723-1848, Ext. 123 Second and Fourth Thursday of each month, opens at 6:30 AM; Clinic ends at 9 AM.  Patients are seen on a first-come first-served basis, and a limited number are seen during each clinic.  ° °[facility] ° [address], [place], [place] (336) 723-7904    Eligibility Requirements °You must have lived in [place], [place], or [place] counties for at least the last three months. °  You cannot be eligible for state or federal sponsored healthcare insurance, including Veterans Administration, Medicaid, or Medicare. °  You generally cannot be eligible for healthcare insurance through your employer.  °  How to apply: °Eligibility screenings are held every Tuesday and Wednesday afternoon from 1:00 pm until 4:00 pm. You do not need an appointment for the interview!  °[facility] [address], [place], [place] 336-631-2330   °[facility] [facility]  336-342-8273   °[facility]  336-703-3100   °[facility]  336-570-6415   ° °Behavioral Health Resources in the Community: °Intensive Outpatient Programs °Organization         Address  Phone  Notes  °[facility] [address], [place], [place] 336-878-6098   °[facility] [address], [place], [place] 336-832-9800   °[facility] [address], [place], [place] ° 336-882-2125   °[facility] [address],  °[place], [place] 1-800-853-5163 or 336-641-4981   °Substance Abuse Resources °Organization         Address  Phone  Notes  °[facility]  336-882-2125   °[facility]  336-784-9470   °[facility]  336-285-9073   °[facility]  336-845-3988   °Residential & Outpatient Substance Abuse Program  1-800-659-3381   °Psychological Services °Organization         Address  Phone  Notes  °[facility]  336- 832-9600   °[facility]  336- 378-7881   °[facility] [address], [place] 1-800-853-5163 or 336-641-4981   ° °Mobile Crisis Teams °Organization         Address  Phone  Notes  °[facility], Mobile Crisis Care Unit  1-877-626-1772   °Assertive °[facility] ° [address]  [place], [place] 336-834-9664   °Sharon DeEsch [address] °[place] 336-554-5454   ° °Self-Help/Support Groups °Organization         Address  Phone             Notes  °[facility] - variety of support groups  336- 373-1402 Call for more information  °Narcotics Anonymous (NA), [facility] [address], °[place]  2 meetings at this location  ° °  Residential Treatment Programs °Organization         Address  Phone  Notes  °[facility] [address],    °[place]  1-866-801-8205   °[facility] ° [address], [place], [place] 704-293-8524   °[facility] [address], [place] 336-845-3988 Admissions: 8am-3pm M-F  °[facility] [address]    °[place], [place] 336-841-1104   °[facility] [address], [place], [place] 336-379-7146   °[facility] [address]  °[place], [place] 336-285-9073   °[facility] - Intensive Outpatient [address], [place], [place] 336-852-3033   °[facility] [address]  °[place], [place] 1-877-615-2722 or 336-784-9470   °[facility] [address] [place], [place] 336-227-7417 Accepts Medicaid  °[facility] [address]  °[place] 1-800-659-3381 Substance Abuse/Addiction Treatment  ° °[place] Behavioral Health Resources °Organization         Address  Phone  Notes  °[facility]  (888) 581-9988   °Julie Brannon, PhD [address] [place], [place]   (336) 349-5553 or (336) 951-0000   °[facility]   [address] °[place], [place] (336) 349-4454   °[facility] [address], [place], [place] (336) 342-8316 Insurance/Medicaid/sponsorship through [facility]  °[facility] [address]                                    [place], [place] (336) 342-8316 Therapy/tele-psych/case    °[facility] [address]  ° [place], [place] (336) 349-2233    °Dr. Arfeen  (336) 349-4544   °[facility] [place]  [employer] [place] [facility] 1) [address], [place] °2) [address], [place] °3)  [address], [place] (336) 349-3220 °(336) 342-7768 ° °(336) 342-8140   °[place] Child Abuse [facility] (336) 342-1394 or (336) 342-3537 (After Hours)    ° ° °Take the prescriptions as directed.  Apply moist heat or ice to the area(s) of discomfort, for 15 minutes at a time, several times per day for the next few days.  Do not fall asleep on a heating or ice pack.  Call your regular medical doctor on Monday to schedule a follow up appointment this week.  Return to the Emergency Department immediately if worsening. ° °

## 2014-01-30 NOTE — ED Provider Notes (Signed)
CSN: [id number]     Arrival date & time 01/30/14  0025 History   First MD Initiated Contact with Patient 01/30/14 0104     Chief Complaint  Patient presents with  . Flank Pain  . Back Pain      HPI  Pt was seen at 0100. Per pt, c/o gradual onset and persistence of constant left sided low back "pain" for the past 2 months, worse over the past 1 to 2 weeks. States the pain "now is on my right side too." Pt states he has been evaluated in the ED x2 this past week for this complaint and "think I need a CT scan to check my internal organs" because "that's what I'm really worried about."  Describes the pain as "throbbing," and "aching," worsens with palpation of the area and body position changes.  Denies incont/retention of bowel or bladder, no saddle anesthesia, no focal motor weakness, no tingling/numbness in extremities, no fevers, no injury, no abd pain, no dysuria/hematuria, no testicular pain/swelling.      Past Medical History  Diagnosis Date  . Chronic leg pain     left  . Chronic back pain    History reviewed. No pertinent past surgical history.  History  Substance Use Topics  . Smoking status: Current Every Day Smoker  . Smokeless tobacco: Not on file  . Alcohol Use: Yes    Review of Systems ROS: Statement: All systems negative except as marked or noted in the HPI; Constitutional: Negative for fever and chills. ; ; Eyes: Negative for eye pain, redness and discharge. ; ; ENMT: Negative for ear pain, hoarseness, nasal congestion, sinus pressure and sore throat. ; ; Cardiovascular: Negative for chest pain, palpitations, diaphoresis, dyspnea and peripheral edema. ; ; Respiratory: Negative for cough, wheezing and stridor. ; ; Gastrointestinal: Negative for nausea, vomiting, diarrhea, abdominal pain, blood in stool, hematemesis, jaundice and rectal bleeding. . ; ; Genitourinary: Negative for dysuria, flank pain and hematuria. ; ; Genital:  No penile drainage or rash, no testicular pain  or swelling, no scrotal rash or swelling. ;; Musculoskeletal: +LBP. Negative for neck pain. Negative for swelling and trauma.; ; Skin: Negative for pruritus, rash, abrasions, blisters, bruising and skin lesion.; ; Neuro: Negative for headache, lightheadedness and neck stiffness. Negative for weakness, altered level of consciousness , altered mental status, extremity weakness, paresthesias, involuntary movement, seizure and syncope.      Allergies  Review of patient's allergies indicates no known allergies.  Home Medications   Prior to Admission medications   Medication Sig Start Date End Date Taking? Authorizing Provider  Aspirin-Salicylamide-Caffeine (BC HEADACHE POWDER PO) Take 1 packet by mouth as needed. For pain or fever    Historical Provider, MD  HYDROcodone-acetaminophen (NORCO) 5-325 MG per tablet Take 1 tablet by mouth every 4 (four) hours as needed. 01/27/14   Flint MelterElliott L Wentz, MD  ibuprofen (ADVIL,MOTRIN) 600 MG tablet Take 1 tablet (600 mg total) by mouth every 6 (six) hours as needed. 01/23/14   Toy BakerAnthony T Allen, MD  meloxicam (MOBIC) 15 MG tablet Take 1 tablet (15 mg total) by mouth daily. 10/04/13   Tonye Pearsonobert P Doolittle, MD  methocarbamol (ROBAXIN-750) 750 MG tablet Take 1 tablet (750 mg total) by mouth 4 (four) times daily. 01/23/14   Toy BakerAnthony T Allen, MD   BP 123/89 mmHg  Pulse 66  Temp(Src) 98.1 F (36.7 C) (Oral)  Resp 20  SpO2 100% Physical Exam  0105: Physical examination:  Nursing notes reviewed; Vital signs and  O2 SAT reviewed;  Constitutional: Well developed, Well nourished, Well hydrated, In no acute distress; Head:  Normocephalic, atraumatic; Eyes: EOMI, PERRL, No scleral icterus; ENMT: Mouth and pharynx normal, Mucous membranes moist; Neck: Supple, Full range of motion, No lymphadenopathy; Cardiovascular: Regular rate and rhythm, No murmur, rub, or gallop; Respiratory: Breath sounds clear & equal bilaterally, No rales, rhonchi, wheezes.  Speaking full sentences with ease,  Normal respiratory effort/excursion; Chest: Nontender, Movement normal; Abdomen: Soft, Nontender, Nondistended, Normal bowel sounds; Genitourinary: No CVA tenderness; Spine:  No midline CS, TS, LS tenderness. +TTP left lumbar paraspinal muscles.;; Extremities: Pulses normal, No tenderness, No edema, No calf edema or asymmetry.; Neuro: AA&Ox3, Major CN grossly intact.  Speech clear. No gross focal motor or sensory deficits in extremities. Strength 5/5 equal bilat UE's and LE's, including great toe dorsiflexion.  DTR 2/4 equal bilat UE's and LE's.  No gross sensory deficits.  Neg straight leg raises bilat. Climbs on and off stretcher easily by himself. Gait steady.; Skin: Color normal, Warm, Dry.   ED Course  Procedures     EKG Interpretation None      MDM  MDM Reviewed: previous chart, nursing note and vitals Reviewed previous: labs Interpretation: CT scan and labs     Results for orders placed or performed during the hospital encounter of 01/30/14  Urinalysis, Routine w reflex microscopic  Result Value Ref Range   Color, Urine YELLOW YELLOW   APPearance CLEAR CLEAR   Specific Gravity, Urine 1.020 1.005 - 1.030   pH 6.0 5.0 - 8.0   Glucose, UA NEGATIVE NEGATIVE mg/dL   Hgb urine dipstick NEGATIVE NEGATIVE   Bilirubin Urine NEGATIVE NEGATIVE   Ketones, ur NEGATIVE NEGATIVE mg/dL   Protein, ur NEGATIVE NEGATIVE mg/dL   Urobilinogen, UA 1.0 0.0 - 1.0 mg/dL   Nitrite NEGATIVE NEGATIVE   Leukocytes, UA NEGATIVE NEGATIVE   Ct Abdomen Pelvis Wo Contrast 01/30/2014   CLINICAL DATA:  Acute onset of right flank pain and left-sided abdominal pain. Initial encounter.  EXAM: CT ABDOMEN AND PELVIS WITHOUT CONTRAST  TECHNIQUE: Multidetector CT imaging of the abdomen and pelvis was performed following the standard protocol without IV contrast.  COMPARISON:  Pelvis radiograph performed 01/27/2014  FINDINGS: The visualized lung bases are clear.  Minimal vaguely decreased attenuation is suggested  within the right hepatic lobe, though this could reflect normal vasculature. Would correlate with LFTs. The spleen is unremarkable in appearance.  The gallbladder is within normal limits. The pancreas and adrenal glands are unremarkable.  The kidneys are unremarkable in appearance. There is no evidence of hydronephrosis. No renal or ureteral stones are seen. No perinephric stranding is appreciated.  No free fluid is identified. The small bowel is unremarkable in appearance. The stomach is within normal limits. No acute vascular abnormalities are seen. Minimal calcification is noted at the distal abdominal aorta and common iliac arteries bilaterally.  The appendix is normal in caliber, without evidence for appendicitis. The colon is unremarkable.  The bladder is decompressed and grossly unremarkable in appearance. The prostate remains normal in size. No inguinal lymphadenopathy is seen.  No acute osseous abnormalities are identified.  IMPRESSION: 1. No acute abnormality seen to explain the patient's symptoms. 2. Minimal vaguely decreased attenuation suggested within the right hepatic lobe, though this could simply reflect normal vasculature. Would correlate with LFTs.   Electronically Signed   By: Roanna Raider M.D.   On: 01/30/2014 02:33    0415:  Pt's abd remains benign. Pt can f/u outpt regarding  CT scan. Pt has been talking on the phone most of his ED visit. Appears NAD. Will tx symptomatically at this time. Dx and testing d/w pt.  Questions answered.  Verb understanding, agreeable to d/c home with outpt f/u.    Samuel Jester, DO 02/02/14 1316

## 2014-01-31 LAB — URINE CULTURE
COLONY COUNT: NO GROWTH
CULTURE: NO GROWTH

## 2014-03-04 ENCOUNTER — Emergency Department (HOSPITAL_COMMUNITY): Payer: Commercial Indemnity

## 2014-03-04 ENCOUNTER — Encounter (HOSPITAL_COMMUNITY): Payer: Self-pay | Admitting: Emergency Medicine

## 2014-03-04 ENCOUNTER — Emergency Department (HOSPITAL_COMMUNITY)
Admission: EM | Admit: 2014-03-04 | Discharge: 2014-03-05 | Disposition: A | Discharge status: Home / Self Care | Payer: Commercial Indemnity | Attending: Emergency Medicine | Admitting: Emergency Medicine

## 2014-03-04 DIAGNOSIS — G8929 Other chronic pain: Secondary | ICD-10-CM | POA: Diagnosis not present

## 2014-03-04 DIAGNOSIS — Z720 Tobacco use: Secondary | ICD-10-CM | POA: Insufficient documentation

## 2014-03-04 DIAGNOSIS — F419 Anxiety disorder, unspecified: Secondary | ICD-10-CM | POA: Diagnosis present

## 2014-03-04 DIAGNOSIS — R079 Chest pain, unspecified: Secondary | ICD-10-CM | POA: Diagnosis not present

## 2014-03-04 DIAGNOSIS — Z791 Long term (current) use of non-steroidal anti-inflammatories (NSAID): Secondary | ICD-10-CM | POA: Insufficient documentation

## 2014-03-04 LAB — CBC
HEMATOCRIT: 48.3 % (ref 39.0–52.0)
Hemoglobin: 16.1 g/dL (ref 13.0–17.0)
MCH: 30 pg (ref 26.0–34.0)
MCHC: 33.3 g/dL (ref 30.0–36.0)
MCV: 90.1 fL (ref 78.0–100.0)
Platelets: 169 10*3/uL (ref 150–400)
RBC: 5.36 MIL/uL (ref 4.22–5.81)
RDW: 13 % (ref 11.5–15.5)
WBC: 7 10*3/uL (ref 4.0–10.5)

## 2014-03-04 LAB — BASIC METABOLIC PANEL
Anion gap: 5 (ref 5–15)
BUN: 16 mg/dL (ref 6–23)
CHLORIDE: 107 mmol/L (ref 96–112)
CO2: 27 mmol/L (ref 19–32)
CREATININE: 1.26 mg/dL (ref 0.50–1.35)
Calcium: 8.9 mg/dL (ref 8.4–10.5)
GFR calc non Af Amer: 69 mL/min — ABNORMAL LOW (ref >90)
GFR, EST AFRICAN AMERICAN: 80 mL/min — AB (ref >90)
GLUCOSE: 83 mg/dL (ref 70–99)
POTASSIUM: 3.8 mmol/L (ref 3.5–5.1)
Sodium: 139 mmol/L (ref 135–145)

## 2014-03-04 LAB — I-STAT TROPONIN, ED: Troponin i, poc: 0 ng/mL (ref 0.00–0.08)

## 2014-03-04 MED ORDER — LORAZEPAM 1 MG PO TABS
1.0000 mg | ORAL_TABLET | Freq: Once | ORAL | Status: AC
  Administered 2014-03-04: 1 mg via ORAL
  Filled 2014-03-04: qty 1

## 2014-03-04 MED ORDER — LORAZEPAM 1 MG PO TABS: 1.0000 mg | ORAL_TABLET | Freq: Three times a day (TID) | ORAL | Status: DC | PRN

## 2014-03-04 NOTE — ED Notes (Signed)
Patient transported to X-ray 

## 2014-03-04 NOTE — ED Notes (Signed)
Pt reports feeling bilateral chest tightness that he states comes in spurts. Pt reports tightness comes on suddenly sometimes along with nausea. Rates tightness 5/10. Pt states he feels like he might have anxiety. Pt is alert and oriented.

## 2014-03-04 NOTE — ED Provider Notes (Signed)
CSN: [id number]     Arrival date & time 03/04/14  1911 History   First MD Initiated Contact with Patient 03/04/14 2137     Chief Complaint  Patient presents with  . Anxiety     (Consider location/radiation/quality/duration/timing/severity/associated sxs/prior Treatment) HPI Comments: Patient presents to the emergency department with chief complaint of left-sided chest pain. He states the pain comes and goes. He states that he thinks he is having a panic attack. States that he has some chills and shakes. He denies any shortness breath, diaphoresis, radiating symptoms. Denies any fevers or cough. States the pain is worsened with palpation. Denies any injury. He has not taken anything to alleviate his symptoms. Denies any heart problems. Denies any history of PE or DVT. Denies any recent travel or surgeries.  The history is provided by the patient. No language interpreter was used.    Past Medical History  Diagnosis Date  . Chronic leg pain     left  . Chronic back pain    History reviewed. No pertinent past surgical history. History reviewed. No pertinent family history. History  Substance Use Topics  . Smoking status: Current Every Day Smoker  . Smokeless tobacco: Not on file  . Alcohol Use: Yes    Review of Systems  Constitutional: Negative for fever and chills.  Respiratory: Negative for shortness of breath.   Cardiovascular: Positive for chest pain.  Gastrointestinal: Negative for nausea, vomiting, diarrhea and constipation.  Genitourinary: Negative for dysuria.  All other systems reviewed and are negative.     Allergies  Review of patient's allergies indicates no known allergies.  Home Medications   Prior to Admission medications   Medication Sig Start Date End Date Taking? Authorizing Provider  methocarbamol (ROBAXIN) 500 MG tablet Take 2 tablets (1,000 mg total) by mouth 4 (four) times daily as needed for muscle spasms (muscle spasm/pain). 01/30/14  Yes Samuel JesterKathleen  McManus, DO  Aspirin-Salicylamide-Caffeine (BC HEADACHE POWDER PO) Take 1 packet by mouth as needed. For pain or fever    Historical Provider, MD  HYDROcodone-acetaminophen (NORCO/VICODIN) 5-325 MG per tablet 1 or 2 tabs PO q6 hours prn pain Patient not taking: Reported on 03/04/2014 01/30/14   Samuel JesterKathleen McManus, DO  ibuprofen (ADVIL,MOTRIN) 600 MG tablet Take 1 tablet (600 mg total) by mouth every 6 (six) hours as needed. Patient not taking: Reported on 03/04/2014 01/23/14   Toy BakerAnthony T Allen, MD  meloxicam (MOBIC) 15 MG tablet Take 1 tablet (15 mg total) by mouth daily. Patient not taking: Reported on 01/30/2014 10/04/13   Tonye Pearsonobert P Doolittle, MD  naproxen (NAPROSYN) 250 MG tablet Take 1 tablet (250 mg total) by mouth 2 (two) times daily as needed for mild pain or moderate pain (take with food). Patient not taking: Reported on 03/04/2014 01/30/14   Samuel JesterKathleen McManus, DO   BP 134/72 mmHg  Pulse 62  Temp(Src) 98.5 F (36.9 C) (Oral)  Resp 18  SpO2 100% Physical Exam  Constitutional: He is oriented to person, place, and time. He appears well-developed and well-nourished.  HENT:  Head: Normocephalic and atraumatic.  Eyes: Conjunctivae and EOM are normal. Pupils are equal, round, and reactive to light. Right eye exhibits no discharge. Left eye exhibits no discharge. No scleral icterus.  Neck: Normal range of motion. Neck supple. No JVD present.  Cardiovascular: Normal rate, regular rhythm and normal heart sounds.  Exam reveals no gallop and no friction rub.   No murmur heard. Pulmonary/Chest: Effort normal and breath sounds normal. No respiratory distress. He  has no wheezes. He has no rales. He exhibits no tenderness.  Abdominal: Soft. He exhibits no distension and no mass. There is no tenderness. There is no rebound and no guarding.  Musculoskeletal: Normal range of motion. He exhibits no edema or tenderness.  Neurological: He is alert and oriented to person, place, and time.  Skin: Skin is warm and  dry.  Psychiatric: He has a normal mood and affect. His behavior is normal. Judgment and thought content normal.  Nursing note and vitals reviewed.   ED Course  Procedures (including critical care time) Results for orders placed or performed during the hospital encounter of 03/04/14  CBC  Result Value Ref Range   WBC 7.0 4.0 - 10.5 K/uL   RBC 5.36 4.22 - 5.81 MIL/uL   Hemoglobin 16.1 13.0 - 17.0 g/dL   HCT 45.4 09.8 - 11.9 %   MCV 90.1 78.0 - 100.0 fL   MCH 30.0 26.0 - 34.0 pg   MCHC 33.3 30.0 - 36.0 g/dL   RDW 14.7 82.9 - 56.2 %   Platelets 169 150 - 400 K/uL  Basic metabolic panel  Result Value Ref Range   Sodium 139 135 - 145 mmol/L   Potassium 3.8 3.5 - 5.1 mmol/L   Chloride 107 96 - 112 mmol/L   CO2 27 19 - 32 mmol/L   Glucose, Bld 83 70 - 99 mg/dL   BUN 16 6 - 23 mg/dL   Creatinine, Ser 1.30 0.50 - 1.35 mg/dL   Calcium 8.9 8.4 - 86.5 mg/dL   GFR calc non Af Amer 69 (L) >90 mL/min   GFR calc Af Amer 80 (L) >90 mL/min   Anion gap 5 5 - 15  I-stat troponin, ED (not at [facility])  Result Value Ref Range   Troponin i, poc 0.00 0.00 - 0.08 ng/mL   Comment 3           Dg Chest 2 View  03/04/2014   CLINICAL DATA:  Left lower lateral chest pain  EXAM: CHEST  2 VIEW  COMPARISON:  01/27/2014  FINDINGS: The heart size and mediastinal contours are within normal limits. Both lungs are clear. The visualized skeletal structures are unremarkable.  IMPRESSION: No active cardiopulmonary disease.   Electronically Signed   By: Ellery Plunk M.D.   On: 03/04/2014 22:50      EKG Interpretation None     ED ECG REPORT  I personally interpreted this EKG   Date: 03/04/2014   Rate: 68  Rhythm: normal sinus rhythm  QRS Axis: normal  Intervals: normal  ST/T Wave abnormalities: normal  Conduction Disutrbances:none  Narrative Interpretation:   Old EKG Reviewed: none available   MDM   Final diagnoses:  Chest pain  Anxiety    Patient with reproducible chest pain. Thinks she is  having a panic attack. Heart score is 1, PERC negative.  Labs are reassuring. EKG is normal. Will give Ativan, will reassess.  Patient feels well. Labs and imaging are reassuring. Will discharge to home. Recommend follow-up with [facility].   Roxy Horseman, PA-C 03/04/14 [id number]  Toy Cookey, MD 03/04/14 [phone]

## 2014-03-04 NOTE — Discharge Instructions (Signed)
Chest Pain (Nonspecific) °It is often hard to give a specific diagnosis for the cause of chest pain. There is always a chance that your pain could be related to something serious, such as a heart attack or a blood clot in the lungs. You need to follow up with your health care provider for further evaluation. °CAUSES  °· Heartburn. °· Pneumonia or bronchitis. °· Anxiety or stress. °· Inflammation around your heart (pericarditis) or lung (pleuritis or pleurisy). °· A blood clot in the lung. °· A collapsed lung (pneumothorax). It can develop suddenly on its own (spontaneous pneumothorax) or from trauma to the chest. °· Shingles infection (herpes zoster virus). °The chest wall is composed of bones, muscles, and cartilage. Any of these can be the source of the pain. °· The bones can be bruised by injury. °· The muscles or cartilage can be strained by coughing or overwork. °· The cartilage can be affected by inflammation and become sore (costochondritis). °DIAGNOSIS  °Lab tests or other studies may be needed to find the cause of your pain. Your health care provider may have you take a test called an ambulatory electrocardiogram (ECG). An ECG records your heartbeat patterns over a 24-hour period. You may also have other tests, such as: °· Transthoracic echocardiogram (TTE). During echocardiography, sound waves are used to evaluate how blood flows through your heart. °· Transesophageal echocardiogram (TEE). °· Cardiac monitoring. This allows your health care provider to monitor your heart rate and rhythm in real time. °· Holter monitor. This is a portable device that records your heartbeat and can help diagnose heart arrhythmias. It allows your health care provider to track your heart activity for several days, if needed. °· Stress tests by exercise or by giving medicine that makes the heart beat faster. °TREATMENT  °· Treatment depends on what may be causing your chest pain. Treatment may include: °¨ Acid blockers for  heartburn. °¨ Anti-inflammatory medicine. °¨ Pain medicine for inflammatory conditions. °¨ Antibiotics if an infection is present. °· You may be advised to change lifestyle habits. This includes stopping smoking and avoiding alcohol, caffeine, and chocolate. °· You may be advised to keep your head raised (elevated) when sleeping. This reduces the chance of acid going backward from your stomach into your esophagus. °Most of the time, nonspecific chest pain will improve within 2-3 days with rest and mild pain medicine.  °HOME CARE INSTRUCTIONS  °· If antibiotics were prescribed, take them as directed. Finish them even if you start to feel better. °· For the next few days, avoid physical activities that bring on chest pain. Continue physical activities as directed. °· Do not use any tobacco products, including cigarettes, chewing tobacco, or electronic cigarettes. °· Avoid drinking alcohol. °· Only take medicine as directed by your health care provider. °· Follow your health care provider's suggestions for further testing if your chest pain does not go away. °· Keep any follow-up appointments you made. If you do not go to an appointment, you could develop lasting (chronic) problems with pain. If there is any problem keeping an appointment, call to reschedule. °SEEK MEDICAL CARE IF:  °· Your chest pain does not go away, even after treatment. °· You have a rash with blisters on your chest. °· You have a fever. °SEEK IMMEDIATE MEDICAL CARE IF:  °· You have increased chest pain or pain that spreads to your arm, neck, jaw, back, or abdomen. °· You have shortness of breath. °· You have an increasing cough, or you cough   up blood. °· You have severe back or abdominal pain. °· You feel nauseous or vomit. °· You have severe weakness. °· You faint. °· You have chills. °This is an emergency. Do not wait to see if the pain will go away. Get medical help at once. Call your local emergency services (911 in [place] Do not drive  yourself to the hospital. °MAKE SURE YOU:  °· Understand these instructions. °· Will watch your condition. °· Will get help right away if you are not doing well or get worse. °Document Released: 10/10/2004 Document Revised: 01/05/2013 Document Reviewed: 08/06/2007 °Exit[employer]® Patient Information ©2015 [employer]. This information is not intended to replace advice given to you by your health care provider. Make sure you discuss any questions you have with your health care provider. °Generalized Anxiety Disorder °Generalized anxiety disorder (GAD) is a mental disorder. It interferes with life functions, including relationships, work, and school. °GAD is different from normal anxiety, which everyone experiences at some point in their lives in response to specific life events and activities. Normal anxiety actually helps us prepare for and get through these life events and activities. Normal anxiety goes away after the event or activity is over.  °GAD causes anxiety that is not necessarily related to specific events or activities. It also causes excess anxiety in proportion to specific events or activities. The anxiety associated with GAD is also difficult to control. GAD can vary from mild to severe. People with severe GAD can have intense waves of anxiety with physical symptoms (panic attacks).  °SYMPTOMS °The anxiety and worry associated with GAD are difficult to control. This anxiety and worry are related to many life events and activities and also occur more days than not for 6 months or longer. People with GAD also have three or more of the following symptoms (one or more in children): °· Restlessness.   °· Fatigue. °· Difficulty concentrating.   °· Irritability. °· Muscle tension. °· Difficulty sleeping or unsatisfying sleep. °DIAGNOSIS °GAD is diagnosed through an assessment by your health care provider. Your health care provider will ask you questions about your mood, physical symptoms, and events in your  life. Your health care provider may ask you about your medical history and use of alcohol or drugs, including prescription medicines. Your health care provider may also do a physical exam and blood tests. Certain medical conditions and the use of certain substances can cause symptoms similar to those associated with GAD. Your health care provider may refer you to a mental health specialist for further evaluation. °TREATMENT °The following therapies are usually used to treat GAD:  °· Medication. Antidepressant medication usually is prescribed for long-term daily control. Antianxiety medicines may be added in severe cases, especially when panic attacks occur.   °· Talk therapy (psychotherapy). Certain types of talk therapy can be helpful in treating GAD by providing support, education, and guidance. A form of talk therapy called cognitive behavioral therapy can teach you healthy ways to think about and react to daily life events and activities. °· Stress management techniques. These include yoga, meditation, and exercise and can be very helpful when they are practiced regularly. °A mental health specialist can help determine which treatment is best for you. Some people see improvement with one therapy. However, other people require a combination of therapies. °Document Released: 04/27/2012 Document Revised: 05/17/2013 Document Reviewed: 04/27/2012 °Exit[employer]® Patient Information ©2015 [employer]. This information is not intended to replace advice given to you by your health care provider. Make sure you discuss any questions you   have with your health care provider. ° °

## 2014-03-04 NOTE — ED Notes (Signed)
Pt sts that [facility] and they also did a scan and found nothing wrong with him.  Pt sts that he feels it was just him having an "anxiety episode".

## 2014-04-08 ENCOUNTER — Emergency Department (HOSPITAL_COMMUNITY): Payer: Commercial Indemnity

## 2014-04-08 ENCOUNTER — Encounter (HOSPITAL_COMMUNITY): Payer: Self-pay | Admitting: Emergency Medicine

## 2014-04-08 ENCOUNTER — Emergency Department (HOSPITAL_COMMUNITY)
Admission: EM | Admit: 2014-04-08 | Discharge: 2014-04-08 | Disposition: A | Discharge status: Home / Self Care | Payer: Commercial Indemnity | Attending: Emergency Medicine | Admitting: Emergency Medicine

## 2014-04-08 DIAGNOSIS — Z720 Tobacco use: Secondary | ICD-10-CM | POA: Diagnosis not present

## 2014-04-08 DIAGNOSIS — R079 Chest pain, unspecified: Secondary | ICD-10-CM | POA: Diagnosis present

## 2014-04-08 DIAGNOSIS — G8929 Other chronic pain: Secondary | ICD-10-CM | POA: Diagnosis not present

## 2014-04-08 DIAGNOSIS — Z791 Long term (current) use of non-steroidal anti-inflammatories (NSAID): Secondary | ICD-10-CM | POA: Insufficient documentation

## 2014-04-08 DIAGNOSIS — F419 Anxiety disorder, unspecified: Secondary | ICD-10-CM | POA: Insufficient documentation

## 2014-04-08 DIAGNOSIS — Z79899 Other long term (current) drug therapy: Secondary | ICD-10-CM | POA: Insufficient documentation

## 2014-04-08 LAB — BASIC METABOLIC PANEL
Anion gap: 8 (ref 5–15)
BUN: 17 mg/dL (ref 6–23)
CALCIUM: 8.9 mg/dL (ref 8.4–10.5)
CHLORIDE: 106 mmol/L (ref 96–112)
CO2: 26 mmol/L (ref 19–32)
Creatinine, Ser: 1.07 mg/dL (ref 0.50–1.35)
GFR calc Af Amer: >90 mL/min (ref >90)
GFR calc non Af Amer: 84 mL/min — ABNORMAL LOW (ref >90)
Glucose, Bld: 97 mg/dL (ref 70–99)
POTASSIUM: 4 mmol/L (ref 3.5–5.1)
SODIUM: 140 mmol/L (ref 135–145)

## 2014-04-08 LAB — CBC
HCT: 45.6 % (ref 39.0–52.0)
Hemoglobin: 14.9 g/dL (ref 13.0–17.0)
MCH: 29.3 pg (ref 26.0–34.0)
MCHC: 32.7 g/dL (ref 30.0–36.0)
MCV: 89.8 fL (ref 78.0–100.0)
PLATELETS: 172 10*3/uL (ref 150–400)
RBC: 5.08 MIL/uL (ref 4.22–5.81)
RDW: 12.9 % (ref 11.5–15.5)
WBC: 6.5 10*3/uL (ref 4.0–10.5)

## 2014-04-08 LAB — I-STAT TROPONIN, ED: Troponin i, poc: 0 ng/mL (ref 0.00–0.08)

## 2014-04-08 NOTE — Discharge Instructions (Signed)
Continue your lorazepam for your anxiety as needed.  Follow-up with your outpatient stress test as scheduled by your primary care physician.

## 2014-04-08 NOTE — ED Notes (Signed)
Per pt, states central chest tightness this am-states going to see cardio in April-states cardiac workups have been normal

## 2014-04-08 NOTE — ED Provider Notes (Signed)
CSN: [id number]     Arrival date & time 04/08/14  1034 History   First MD Initiated Contact with Patient 04/08/14 1044     Chief Complaint  Patient presents with  . Chest Pain      HPI  She presents for evaluation of chest pain. His description of his symptoms to me is that he will feel suddenly "paranoid and anxious". Occasionally chest will become tight. He seen his primary care physician a few times over the last several weeks. Placed on an unknown anti-inflammatory. When necessary Ativan. He is taking anti-inflammatory almost daily. Had an episode today sitting at home with sudden onset of symptoms. He took a lorazepam. His symptoms have resolved upon his arrival here.  History of heart disease. He is a smoker. No history of hypertension diabetes , or hyper cholesterolemia.  No cocaine or stability use.  Past Medical History  Diagnosis Date  . Chronic leg pain     left  . Chronic back pain    History reviewed. No pertinent past surgical history. History reviewed. No pertinent family history. History  Substance Use Topics  . Smoking status: Current Every Day Smoker  . Smokeless tobacco: Not on file  . Alcohol Use: Yes    Review of Systems  Constitutional: Negative for fever, chills, diaphoresis, appetite change and fatigue.  HENT: Negative for mouth sores, sore throat and trouble swallowing.   Eyes: Negative for visual disturbance.  Respiratory: Positive for chest tightness. Negative for cough, shortness of breath and wheezing.   Cardiovascular: Negative for chest pain.  Gastrointestinal: Negative for nausea, vomiting, abdominal pain, diarrhea and abdominal distention.  Endocrine: Negative for polydipsia, polyphagia and polyuria.  Genitourinary: Negative for dysuria, frequency and hematuria.  Musculoskeletal: Negative for gait problem.  Skin: Negative for color change, pallor and rash.  Neurological: Negative for dizziness, syncope, light-headedness and headaches.   Hematological: Does not bruise/bleed easily.  Psychiatric/Behavioral: Negative for behavioral problems and confusion. The patient is nervous/anxious.       Allergies  Review of patient's allergies indicates no known allergies.  Home Medications   Prior to Admission medications   Medication Sig Start Date End Date Taking? Authorizing Provider  Aspirin-Salicylamide-Caffeine (BC HEADACHE POWDER PO) Take 1 packet by mouth as needed. For pain or fever   Yes Historical Provider, MD  LORazepam (ATIVAN) 1 MG tablet Take 1 tablet (1 mg total) by mouth every 8 (eight) hours as needed for anxiety. 03/04/14  Yes Roxy Horsemanobert Browning, PA-C  methocarbamol (ROBAXIN) 500 MG tablet Take 2 tablets (1,000 mg total) by mouth 4 (four) times daily as needed for muscle spasms (muscle spasm/pain). 01/30/14  Yes Samuel JesterKathleen McManus, DO  omeprazole (PRILOSEC) 20 MG capsule Take 1 capsule by mouth daily. 03/18/14  Yes Historical Provider, MD  HYDROcodone-acetaminophen (NORCO/VICODIN) 5-325 MG per tablet 1 or 2 tabs PO q6 hours prn pain Patient not taking: Reported on 03/04/2014 01/30/14   Samuel JesterKathleen McManus, DO  ibuprofen (ADVIL,MOTRIN) 600 MG tablet Take 1 tablet (600 mg total) by mouth every 6 (six) hours as needed. Patient not taking: Reported on 03/04/2014 01/23/14   Lorre NickAnthony Allen, MD  meloxicam (MOBIC) 15 MG tablet Take 1 tablet (15 mg total) by mouth daily. Patient not taking: Reported on 01/30/2014 10/04/13   Tonye Pearsonobert P Doolittle, MD  naproxen (NAPROSYN) 250 MG tablet Take 1 tablet (250 mg total) by mouth 2 (two) times daily as needed for mild pain or moderate pain (take with food). Patient not taking: Reported on 03/04/2014 01/30/14  Samuel Jester, DO   BP 111/63 mmHg  Pulse 71  Temp(Src) 98.1 F (36.7 C) (Oral)  Resp 15  SpO2 98% Physical Exam  Constitutional: He is oriented to person, place, and time. He appears well-developed and well-nourished. No distress.  HENT:  Head: Normocephalic.  Eyes: Conjunctivae are  normal. Pupils are equal, round, and reactive to light. No scleral icterus.  Neck: Normal range of motion. Neck supple. No thyromegaly present.  Cardiovascular: Normal rate and regular rhythm.  Exam reveals no gallop and no friction rub.   No murmur heard. Pulmonary/Chest: Effort normal and breath sounds normal. No respiratory distress. He has no wheezes. He has no rales.  Abdominal: Soft. Bowel sounds are normal. He exhibits no distension. There is no tenderness. There is no rebound.  Musculoskeletal: Normal range of motion.  Neurological: He is alert and oriented to person, place, and time.  Skin: Skin is warm and dry. No rash noted.  Psychiatric: He has a normal mood and affect. His behavior is normal.    ED Course  Procedures (including critical care time) Labs Review Labs Reviewed  BASIC METABOLIC PANEL - Abnormal; Notable for the following:    GFR calc non Af Amer 84 (*)    All other components within normal limits  CBC  I-STAT TROPOININ, ED    Imaging Review Dg Chest 2 View  04/08/2014   CLINICAL DATA:  Chest tightness  EXAM: CHEST  2 VIEW  COMPARISON:  03/04/2014  FINDINGS: The heart size and mediastinal contours are within normal limits. Both lungs are clear. The visualized skeletal structures are unremarkable.  IMPRESSION: No active cardiopulmonary disease.   Electronically Signed   By: Alcide Clever M.D.   On: 04/08/2014 11:21     EKG Interpretation   Date/Time:  Friday April 08 2014 10:42:49 EDT Ventricular Rate:  66 PR Interval:  169 QRS Duration: 84 QT Interval:  368 QTC Calculation: 385 R Axis:   79 Text Interpretation:  Sinus rhythm Abnormal R-wave progression, late  transition Confirmed by Fayrene Fearing  MD, Wasif Simonich ([id number]) on 04/08/2014 1:29:08 PM      MDM   Final diagnoses:  Anxiety    Normal EKG. Troponin. His heart score is 1 or 2. Think is appropriate for continued outpatient treatment with his primary care physician with lorazepam when necessary for his  anxiety, and his follow-up outpatient stress test.    Rolland Porter, MD 04/08/14 1330

## 2014-04-22 ENCOUNTER — Ambulatory Visit (INDEPENDENT_AMBULATORY_CARE_PROVIDER_SITE_OTHER): Payer: Managed Care, Other (non HMO) | Admitting: Podiatry

## 2014-04-22 ENCOUNTER — Encounter: Payer: Self-pay | Admitting: Podiatry

## 2014-04-22 VITALS — BP 110/65 | HR 74 | Resp 18

## 2014-04-22 DIAGNOSIS — M79673 Pain in unspecified foot: Secondary | ICD-10-CM

## 2014-04-22 DIAGNOSIS — L84 Corns and callosities: Secondary | ICD-10-CM

## 2014-04-22 NOTE — Progress Notes (Signed)
   Subjective:    Patient ID: Andrew Mcbride, male    DOB: 1970/03/22, 44 y.o.   MRN: [id number]  HPI  44 year old male presents the office today with complaints of painful calluses to the bottoms of both his feet. He denies any redness or drainage from the sites. Also states that he has thick discolored toenails which are nonpainful denies any redness or drainage. Has a no prior treatment for this. No other complaints at this time.  Review of Systems  Musculoskeletal: Positive for gait problem.  All other systems reviewed and are negative.      Objective:   Physical Exam AAO x3, NAD DP/PT pulses palpable bilaterally, CRT less than 3 seconds Protective sensation intact with Simms Weinstein monofilament, vibratory sensation intact, Achilles tendon reflex intact Hyperkeratotic lesions bilateral submetatarsal 5 as well as left lateral fifth digit. Upon debridement lesion underlying skin is intact and there is no drainage, ulceration, or any other clinical signs of infection. There is slight adductovarus of the fifth digits bilaterally. The nails are hypertrophic, dystrophic, discolored without any tenderness to the nails. There is no swelling erythema or drainage along nail sites. Noother areas of tenderness to bilateral lower extremities. MMT 5/5, ROM WNL.  No open lesions or otherpre-ulcerative lesions.  No overlying edema, erythema, increase in warmth to bilateral lower extremities.  No pain with calf compression, swelling, warmth, erythema bilaterally.       Assessment & Plan:  44 year old male with symptomatic hyperkeratotic lesions, onychomycosis -Treatment options discussed included alternatives, risks, competitions -Hyperkeratotic lesion sharply debrided 3 without complication/bleeding. -In regards onychomycosis discussed years treatment options. He will with purchasing over-the-counter fungi nail. -follow-up as needed. In the meantime encouraged to call the office with any  questions, concerns, change in symptoms.

## 2014-04-25 ENCOUNTER — Encounter: Payer: Self-pay | Admitting: Podiatry

## 2014-05-02 DIAGNOSIS — R079 Chest pain, unspecified: Secondary | ICD-10-CM | POA: Insufficient documentation

## 2014-05-02 DIAGNOSIS — F419 Anxiety disorder, unspecified: Secondary | ICD-10-CM | POA: Insufficient documentation

## 2014-05-03 ENCOUNTER — Ambulatory Visit (INDEPENDENT_AMBULATORY_CARE_PROVIDER_SITE_OTHER): Payer: Managed Care, Other (non HMO) | Admitting: Interventional Cardiology

## 2014-05-03 ENCOUNTER — Encounter: Payer: Self-pay | Admitting: Interventional Cardiology

## 2014-05-03 ENCOUNTER — Ambulatory Visit: Payer: Self-pay | Admitting: Interventional Cardiology

## 2014-05-03 DIAGNOSIS — R002 Palpitations: Secondary | ICD-10-CM | POA: Diagnosis not present

## 2014-05-03 DIAGNOSIS — R072 Precordial pain: Secondary | ICD-10-CM | POA: Diagnosis not present

## 2014-05-03 DIAGNOSIS — Z720 Tobacco use: Secondary | ICD-10-CM | POA: Diagnosis not present

## 2014-05-03 DIAGNOSIS — F419 Anxiety disorder, unspecified: Secondary | ICD-10-CM | POA: Diagnosis not present

## 2014-05-03 NOTE — Patient Instructions (Signed)
Medication Instructions:  Your physician recommends that you continue on your current medications as directed. Please refer to the Current Medication list given to you today.   Labwork: None   Testing/Procedures: Your physician has requested that you have an exercise tolerance test. For further information please visit [email or web address]. Please also follow instruction sheet, as given.   Follow-Up: Your physician recommends that you schedule a follow-up appointment pending results  Any Other Special Instructions Will Be Listed Below (If Applicable).   

## 2014-05-03 NOTE — Progress Notes (Signed)
Cardiology Office Note   Date:  05/03/2014   ID:  Andrew Mcbride, DOB Nov 15, 1970, MRN [id number]  PCP:  Farris HasMORROW, AARON, MD  Cardiologist:   Lesleigh NoeSMITH III,HENRY W, MD   Chief Complaint  Patient presents with  . Chest Pain      History of Present Illness: Andrew Mcbride is a 44 y.o. male who presents for anxiety, chest discomfort, and palpitations. He is referred by Dr. Kateri PlummerMorrow for these complaints. He has a difficult time articulating his symptoms. None of the symptoms are exertion related. He is on a benzodiazepine and states that if he does not use it on a regular basis his arms started tingling. If his arms started tingling he then develops on occasion tightness in his chest. He feels that the medication helps to circumvent the episodes. [place] and is able to do all of his occupational requirements without discomfort or limitations. He denies orthopnea, PND, syncope, palpitations, and edema.  He smokes cigarettes cigarettes. His mother had a stent in her 4470s.  Past Medical History  Diagnosis Date  . Chronic leg pain     left  . Chronic back pain   . Chest pain   . Palpitation   . Anxiety     History reviewed. No pertinent past surgical history.   Current Outpatient Prescriptions  Medication Sig Dispense Refill  . LORazepam (ATIVAN) 1 MG tablet Take 1 tablet (1 mg total) by mouth every 8 (eight) hours as needed for anxiety. 7 tablet 0  . omeprazole (PRILOSEC) 20 MG capsule Take 1 capsule by mouth daily as needed (for acid reflux).     Marland Kitchen. HYDROcodone-acetaminophen (NORCO/VICODIN) 5-325 MG per tablet 1 or 2 tabs PO q6 hours prn pain (Patient not taking: Reported on 03/04/2014) 12 tablet 0  . methocarbamol (ROBAXIN) 500 MG tablet Take 2 tablets (1,000 mg total) by mouth 4 (four) times daily as needed for muscle spasms (muscle spasm/pain). 25 tablet 0  . methocarbamol (ROBAXIN) 750 MG tablet     . naproxen (NAPROSYN) 250 MG tablet Take 1  tablet (250 mg total) by mouth 2 (two) times daily as needed for mild pain or moderate pain (take with food). (Patient not taking: Reported on 03/04/2014) 14 tablet 0   No current facility-administered medications for this visit.    Allergies:   Review of patient's allergies indicates no known allergies.    Social History:  The patient  reports that he has been smoking.  He does not have any smokeless tobacco history on file. He reports that he drinks alcohol. He reports that he does not use illicit drugs.   Family History:  The patient's family history includes Hyperlipidemia in his mother; Lung cancer in his father.    ROS:  Please see the history of present illness.   Otherwise, review of systems are positive for anxiety and frustration.   All other systems are reviewed and negative.    PHYSICAL EXAM: VS:  BP 100/60 mmHg  Pulse 67  Ht 5\' 11"  (1.803 m)  Wt 204 lb 12.8 oz (92.897 kg)  BMI 28.58 kg/m2 , BMI Body mass index is 28.58 kg/(m^2). GEN: Well nourished, well developed, in no acute distress HEENT: normal Neck: no JVD, carotid bruits, or masses Cardiac: RRR; no murmurs, rubs, or gallops,no edema  Respiratory:  clear to auscultation bilaterally, normal work of breathing GI: soft, nontender, nondistended, + BS MS: no deformity or atrophy Skin: warm and dry, no  rash Neuro:  Strength and sensation are intact Psych: euthymic mood, full affect   EKG:  EKG is ordered today. The ekg ordered today demonstrate normal   Recent Labs: 01/27/2014: B Natriuretic Peptide 24.8 04/08/2014: BUN 17; Creatinine 1.07; Hemoglobin 14.9; Platelets 172; Potassium 4.0; Sodium 140    Lipid Panel No results found for: CHOL, TRIG, HDL, CHOLHDL, VLDL, LDLCALC, LDLDIRECT    Wt Readings from Last 3 Encounters:  05/03/14 204 lb 12.8 oz (92.897 kg)  01/27/14 202 lb (91.627 kg)  01/23/14 205 lb (92.987 kg)      Other studies Reviewed: Additional studies/ records that were reviewed today  include: See emergency room records. Review of the above records demonstrates:    ASSESSMENT AND PLAN:  Precordial pain: Atypical and in the setting of anxiety. He does have a risk factor in smoking  Snoring: Exclude sleep apnea as the source of the patient's fatigue and anxiety  Anxiety: Newly developed over the past 6 months  Tobacco abuse: Urged to discontinue  Palpitations: History suggest premature beats     Current medicines are reviewed at length with the patient today.  The patient does not have concerns regarding medicines.  The following changes have been made:  no change   We will perform an exercise treadmill test to exclude myocardial ischemia and evaluate exertional tolerance. We should also consider a sleep study on this patient as it may be the source of his overall anxiety state.  Labs/ tests ordered today include:   Orders Placed This Encounter  Procedures  . EKG 12-Lead  . Exercise tolerance test     Disposition:   FU with Mendel Ryder in 1 year     Signed, Lesleigh Noe, MD  05/03/2014 8:54 AM    [facility] [address] [place], [place], [place]  [address] Phone: [phone]; Fax: [phone]

## 2014-05-05 ENCOUNTER — Other Ambulatory Visit: Payer: Self-pay

## 2014-05-05 ENCOUNTER — Ambulatory Visit (HOSPITAL_COMMUNITY)
Admission: RE | Admit: 2014-05-05 | Discharge: 2014-05-05 | Disposition: A | Discharge status: Home / Self Care | Payer: Managed Care, Other (non HMO) | Source: Ambulatory Visit | Attending: Interventional Cardiology | Admitting: Interventional Cardiology

## 2014-05-05 ENCOUNTER — Telehealth: Payer: Self-pay | Admitting: Interventional Cardiology

## 2014-05-05 DIAGNOSIS — R072 Precordial pain: Secondary | ICD-10-CM | POA: Insufficient documentation

## 2014-05-05 DIAGNOSIS — R002 Palpitations: Secondary | ICD-10-CM | POA: Insufficient documentation

## 2014-05-05 NOTE — Progress Notes (Signed)
GXT Pt exercised 12 minutes of Bruce. Max HR 153, > 85% of APMHR. No chest pain. Vague Rt arm tingling but this di not get worse with exercise. No significant ST changes or arrhythmia. Max B/P 162/63. Adequate HR, negative treadmill.  Corine ShelterLUKE Mikylah Ackroyd PA-C 05/05/2014 1:42 PM

## 2014-05-05 NOTE — Telephone Encounter (Signed)
Stress test shows he did well and we do not think the arm or chet discomfort is heart related.

## 2014-05-05 NOTE — Telephone Encounter (Signed)
Pt aware of gxt results with verbal understanding. Stress test shows he did well and we do not think the arm or chet discomfort is heart related.

## 2014-09-06 ENCOUNTER — Encounter (HOSPITAL_COMMUNITY): Payer: Self-pay | Admitting: Emergency Medicine

## 2014-09-06 ENCOUNTER — Emergency Department (HOSPITAL_COMMUNITY)
Admission: EM | Admit: 2014-09-06 | Discharge: 2014-09-07 | Disposition: A | Discharge status: Home / Self Care | Payer: Managed Care, Other (non HMO) | Attending: Emergency Medicine | Admitting: Emergency Medicine

## 2014-09-06 ENCOUNTER — Emergency Department (HOSPITAL_COMMUNITY): Payer: Managed Care, Other (non HMO)

## 2014-09-06 DIAGNOSIS — G8929 Other chronic pain: Secondary | ICD-10-CM | POA: Insufficient documentation

## 2014-09-06 DIAGNOSIS — R0789 Other chest pain: Secondary | ICD-10-CM | POA: Diagnosis not present

## 2014-09-06 DIAGNOSIS — Z79899 Other long term (current) drug therapy: Secondary | ICD-10-CM | POA: Diagnosis not present

## 2014-09-06 DIAGNOSIS — R079 Chest pain, unspecified: Secondary | ICD-10-CM | POA: Diagnosis present

## 2014-09-06 DIAGNOSIS — K2100 Gastro-esophageal reflux disease with esophagitis, without bleeding: Secondary | ICD-10-CM

## 2014-09-06 DIAGNOSIS — Z87891 Personal history of nicotine dependence: Secondary | ICD-10-CM | POA: Insufficient documentation

## 2014-09-06 DIAGNOSIS — F419 Anxiety disorder, unspecified: Secondary | ICD-10-CM | POA: Diagnosis not present

## 2014-09-06 LAB — CBC
HCT: 45 % (ref 39.0–52.0)
HEMOGLOBIN: 14.8 g/dL (ref 13.0–17.0)
MCH: 29.1 pg (ref 26.0–34.0)
MCHC: 32.9 g/dL (ref 30.0–36.0)
MCV: 88.6 fL (ref 78.0–100.0)
PLATELETS: 194 10*3/uL (ref 150–400)
RBC: 5.08 MIL/uL (ref 4.22–5.81)
RDW: 12.6 % (ref 11.5–15.5)
WBC: 8.2 10*3/uL (ref 4.0–10.5)

## 2014-09-06 MED ORDER — GI COCKTAIL ~~LOC~~
30.0000 mL | Freq: Once | ORAL | Status: AC
  Administered 2014-09-07: 30 mL via ORAL
  Filled 2014-09-06: qty 30

## 2014-09-06 NOTE — ED Provider Notes (Signed)
CSN: [id number]   Arrival date & time 09/06/14 2305  History  This chart was scribed for Andrew Racer, MD by Bethel Born, ED Scribe. This patient was seen in room WA13/WA13 and the patient's care was started at 11:31 PM.  Chief Complaint  Patient presents with  . Chest Pain    HPI The history is provided by the patient. Mcbride language interpreter was used.   Andrew Mcbride is a 44 y.o. male with PMHx of anxiety who presents to the Emergency Department complaining of constant left-sided chest pain with gradual onset 1 hour ago after eating pizza. The pain is described as tight , throbbing, and "like heartburn or something". The pain has improved since initial onset. Mcbride palliating or potentiating factors. Pt denies SOB, nausea, and LE swelling. Mcbride difficulty with swallowing. He notes having several recent episodes of chest pain.  He was told in the past that his chest pain was due to anxiety. He had a negative stress test in Dr. Michaelle Copas office in April of this year. Mcbride recent long travel.  Past Medical History  Diagnosis Date  . Chronic leg pain     left  . Chronic back pain   . Chest pain   . Palpitation   . Anxiety     History reviewed. Mcbride pertinent past surgical history.  Family History  Problem Relation Age of Onset  . Hyperlipidemia Mother   . Heart attack Mother   . Lung cancer Father     Social History  Substance Use Topics  . Smoking status: Former Games developer  . Smokeless tobacco: None  . Alcohol Use: Yes     Comment: occ     Review of Systems  Constitutional: Negative for fever and chills.  Respiratory: Negative for shortness of breath.   Cardiovascular: Positive for chest pain. Negative for palpitations and leg swelling.  Gastrointestinal: Negative for nausea, vomiting, abdominal pain and diarrhea.  Musculoskeletal: Negative for back pain, neck pain and neck stiffness.  Skin: Negative for rash and wound.  Neurological: Negative for dizziness, syncope, weakness,  light-headedness, numbness and headaches.  All other systems reviewed and are negative.   Home Medications   Prior to Admission medications   Medication Sig Start Date End Date Taking? Authorizing Provider  LORazepam (ATIVAN) 1 MG tablet Take 1 tablet (1 mg total) by mouth every 8 (eight) hours as needed for anxiety. 03/04/14  Yes Roxy Horseman, PA-C  omeprazole (PRILOSEC) 20 MG capsule Take 40 mg by mouth daily as needed (for acid reflux).  03/18/14  Yes Historical Provider, MD  HYDROcodone-acetaminophen (NORCO/VICODIN) 5-325 MG per tablet 1 or 2 tabs PO q6 hours prn pain Patient not taking: Reported on 03/04/2014 01/30/14   Samuel Jester, DO  methocarbamol (ROBAXIN) 500 MG tablet Take 2 tablets (1,000 mg total) by mouth 4 (four) times daily as needed for muscle spasms (muscle spasm/pain). Patient not taking: Reported on 09/06/2014 01/30/14   Samuel Jester, DO  naproxen (NAPROSYN) 250 MG tablet Take 1 tablet (250 mg total) by mouth 2 (two) times daily as needed for mild pain or moderate pain (take with food). Patient not taking: Reported on 03/04/2014 01/30/14   Samuel Jester, DO    Allergies  Review of patient's allergies indicates Mcbride known allergies.  Triage Vitals: BP 124/69 mmHg  Pulse 63  Temp(Src) 98.5 F (36.9 C) (Oral)  Resp 20  SpO2 100%  Physical Exam  Constitutional: He is oriented to person, place, and time. He appears well-developed and well-nourished. Mcbride  distress.  HENT:  Head: Normocephalic and atraumatic.  Mouth/Throat: Oropharynx is clear and moist.  Eyes: EOM are normal. Pupils are equal, round, and reactive to light.  Neck: Normal range of motion. Neck supple.  Cardiovascular: Normal rate and regular rhythm.  Exam reveals Mcbride gallop and Mcbride friction rub.   Mcbride murmur heard. Pulmonary/Chest: Effort normal and breath sounds normal. Mcbride respiratory distress. He has Mcbride wheezes. He has Mcbride rales. He exhibits Mcbride tenderness.  Abdominal: Soft. Bowel sounds are normal.  He exhibits Mcbride distension and Mcbride mass. There is Mcbride tenderness. There is Mcbride rebound and Mcbride guarding.  Musculoskeletal: Normal range of motion. He exhibits Mcbride edema or tenderness.  Mcbride lower extremity swelling or pain.  Neurological: He is alert and oriented to person, place, and time.  Moves all all extremities without deficit. Sensation is intact  Skin: Skin is warm and dry. Mcbride rash noted. Mcbride erythema.  Psychiatric: He has a normal mood and affect. His behavior is normal.  Nursing note and vitals reviewed.   ED Course  Procedures   DIAGNOSTIC STUDIES: Oxygen Saturation is 100% on RA, normal by my interpretation.    COORDINATION OF CARE: 11:35 PM Discussed treatment plan which includes CXR, EKG, and lab work with pt at bedside and pt agreed to plan.  Labs Reviewed  BASIC METABOLIC PANEL - Abnormal; Notable for the following:    Glucose, Bld 106 (*)    Creatinine, Ser 1.26 (*)    All other components within normal limits  CBC  I-STAT TROPOININ, ED    I, Andrew Racer, MD, personally reviewed and evaluated these images and lab results as part of my medical decision-making.  Imaging Review Dg Chest 2 View  09/06/2014   CLINICAL DATA:  Left-sided non radiating chest pain, onset tonight after eating pizza.  EXAM: CHEST  2 VIEW  COMPARISON:  04/08/2014  FINDINGS: The heart size and mediastinal contours are within normal limits. Both lungs are clear. The visualized skeletal structures are unremarkable.  IMPRESSION: Mcbride active cardiopulmonary disease.   Electronically Signed   By: Burman Nieves M.D.   On: 09/06/2014 23:53    EKG Interpretation  Date/Time:  Tuesday September 06 2014 23:18:50 EDT Ventricular Rate:  62 PR Interval:  179 QRS Duration: 85 QT Interval:  373 QTC Calculation: 379 R Axis:   73 Text Interpretation:  Sinus rhythm Confirmed by Ranae Palms  MD, Delmar Arriaga ([id number]) on 09/06/2014 11:48:25 PM       MDM   Final diagnoses:  Gastroesophageal reflux disease with  esophagitis  Atypical chest pain     I personally performed the services described in this documentation, which was scribed in my presence. The recorded information has been reviewed and is accurate.   Patient states his chest symptoms have completely resolved after GI cocktail. EKG, chest x-ray and troponin are all within normal limits. He is recently been evaluated for coronary artery disease including a negative stress test. His symptoms are very atypical for coronary artery disease. This started after eating pizza and he describes the feeling like "heartburn". A junk believe that further workup is necessary in the emergency department. He is advised to continue Prilosec and will give dietary precautions. Is also advised to follow-up with gastroenterology for ongoing symptoms. Advised patient he can take over-the-counter Mylanta, Maalox or Tums. He understands the need to return immediately for worsening or new symptoms.  Andrew Racer, MD 09/07/14 [phone]

## 2014-09-06 NOTE — ED Notes (Signed)
Pt states he feels like he has a knot in his chest on the left side  Pt states it started about an hour and a half ago  Pt states it happens often  Pt states it happened after he ate pizza earlier tonight   Pt states it feels like heartburn or something

## 2014-09-07 LAB — BASIC METABOLIC PANEL
ANION GAP: 9 (ref 5–15)
BUN: 16 mg/dL (ref 6–20)
CHLORIDE: 105 mmol/L (ref 101–111)
CO2: 26 mmol/L (ref 22–32)
Calcium: 9.2 mg/dL (ref 8.9–10.3)
Creatinine, Ser: 1.26 mg/dL — ABNORMAL HIGH (ref 0.61–1.24)
GFR calc non Af Amer: >60 mL/min (ref >60)
Glucose, Bld: 106 mg/dL — ABNORMAL HIGH (ref 65–99)
POTASSIUM: 4.1 mmol/L (ref 3.5–5.1)
SODIUM: 140 mmol/L (ref 135–145)

## 2014-09-07 LAB — I-STAT TROPONIN, ED: Troponin i, poc: 0 ng/mL (ref 0.00–0.08)

## 2014-09-07 MED ORDER — OMEPRAZOLE 40 MG PO CPDR: 40.0000 mg | DELAYED_RELEASE_CAPSULE | Freq: Every day | ORAL | Status: DC

## 2014-09-07 NOTE — Discharge Instructions (Signed)
Take your Prilosec daily. You may take Maalox, Mylanta or Tums if you experienced these symptoms again. If you have persistent heartburn-like symptoms, despite medication follow-up with the gastroenterologist. Avoid ibuprofen, naproxen and all similar medicines. Avoid eating spicy and acidic foods. Avoid eating shortly before bedtime. Sleep with your head propped up on several pillows. Return immediately for worsening symptoms, difficulty breathing or any concerns.  Chest Pain (Nonspecific) It is often hard to give a specific diagnosis for the cause of chest pain. There is always a chance that your pain could be related to something serious, such as a heart attack or a blood clot in the lungs. You need to follow up with your health care provider for further evaluation. CAUSES   Heartburn.  Pneumonia or bronchitis.  Anxiety or stress.  Inflammation around your heart (pericarditis) or lung (pleuritis or pleurisy).  A blood clot in the lung.  A collapsed lung (pneumothorax). It can develop suddenly on its own (spontaneous pneumothorax) or from trauma to the chest.  Shingles infection (herpes zoster virus). The chest wall is composed of bones, muscles, and cartilage. Any of these can be the source of the pain.  The bones can be bruised by injury.  The muscles or cartilage can be strained by coughing or overwork.  The cartilage can be affected by inflammation and become sore (costochondritis). DIAGNOSIS  Lab tests or other studies may be needed to find the cause of your pain. Your health care provider may have you take a test called an ambulatory electrocardiogram (ECG). An ECG records your heartbeat patterns over a 24-hour period. You may also have other tests, such as:  Transthoracic echocardiogram (TTE). During echocardiography, sound waves are used to evaluate how blood flows through your heart.  Transesophageal echocardiogram (TEE).  Cardiac monitoring. This allows your health care  provider to monitor your heart rate and rhythm in real time.  Holter monitor. This is a portable device that records your heartbeat and can help diagnose heart arrhythmias. It allows your health care provider to track your heart activity for several days, if needed.  Stress tests by exercise or by giving medicine that makes the heart beat faster. TREATMENT   Treatment depends on what may be causing your chest pain. Treatment may include:  Acid blockers for heartburn.  Anti-inflammatory medicine.  Pain medicine for inflammatory conditions.  Antibiotics if an infection is present.  You may be advised to change lifestyle habits. This includes stopping smoking and avoiding alcohol, caffeine, and chocolate.  You may be advised to keep your head raised (elevated) when sleeping. This reduces the chance of acid going backward from your stomach into your esophagus. Most of the time, nonspecific chest pain will improve within 2-3 days with rest and mild pain medicine.  HOME CARE INSTRUCTIONS   If antibiotics were prescribed, take them as directed. Finish them even if you start to feel better.  For the next few days, avoid physical activities that bring on chest pain. Continue physical activities as directed.  Do not use any tobacco products, including cigarettes, chewing tobacco, or electronic cigarettes.  Avoid drinking alcohol.  Only take medicine as directed by your health care provider.  Follow your health care provider's suggestions for further testing if your chest pain does not go away.  Keep any follow-up appointments you made. If you do not go to an appointment, you could develop lasting (chronic) problems with pain. If there is any problem keeping an appointment, call to reschedule. SEEK MEDICAL CARE  IF:   Your chest pain does not go away, even after treatment.  You have a rash with blisters on your chest.  You have a fever. SEEK IMMEDIATE MEDICAL CARE IF:   You have  increased chest pain or pain that spreads to your arm, neck, jaw, back, or abdomen.  You have shortness of breath.  You have an increasing cough, or you cough up blood.  You have severe back or abdominal pain.  You feel nauseous or vomit.  You have severe weakness.  You faint.  You have chills. This is an emergency. Do not wait to see if the pain will go away. Get medical help at once. Call your local emergency services (911 in [place] Do not drive yourself to the hospital. MAKE SURE YOU:   Understand these instructions.  Will watch your condition.  Will get help right away if you are not doing well or get worse. Document Released: 10/10/2004 Document Revised: 01/05/2013 Document Reviewed: 08/06/2007 [facility] Patient Information 2015 [place], [place]. This information is not intended to replace advice given to you by your health care provider. Make sure you discuss any questions you have with your health care provider.    Food Choices for Gastroesophageal Reflux Disease When you have gastroesophageal reflux disease (GERD), the foods you eat and your eating habits are very important. Choosing the right foods can help ease your discomfort.  WHAT GUIDELINES DO I NEED TO FOLLOW?   Choose fruits, vegetables, whole grains, and low-fat dairy products.   Choose low-fat meat, fish, and poultry.  Limit fats such as oils, salad dressings, butter, nuts, and avocado.   Keep a food diary. This helps you identify foods that cause symptoms.   Avoid foods that cause symptoms. These may be different for everyone.   Eat small meals often instead of 3 large meals a day.   Eat your meals slowly, in a place where you are relaxed.   Limit fried foods.   Cook foods using methods other than frying.   Avoid drinking alcohol.   Avoid drinking large amounts of liquids with your meals.   Avoid bending over or lying down until 2-3 hours after eating.  WHAT FOODS ARE NOT RECOMMENDED?   These are some foods and drinks that may make your symptoms worse: Vegetables Tomatoes. Tomato juice. Tomato and spaghetti sauce. Chili peppers. Onion and garlic. Horseradish. Fruits Oranges, grapefruit, and lemon (fruit and juice). Meats High-fat meats, fish, and poultry. This includes hot dogs, ribs, ham, sausage, salami, and bacon. Dairy Whole milk and chocolate milk. Sour cream. Cream. Butter. Ice cream. Cream cheese.  Drinks Coffee and tea. Bubbly (carbonated) drinks or energy drinks. Condiments Hot sauce. Barbecue sauce.  Sweets/Desserts Chocolate and cocoa. Donuts. Peppermint and spearmint. Fats and Oils High-fat foods. This includes [place] fries and potato chips. Other Vinegar. Strong spices. This includes black pepper, white pepper, red pepper, cayenne, curry powder, cloves, ginger, and chili powder. The items listed above may not be a complete list of foods and drinks to avoid. Contact your dietitian for more information. Document Released: 07/02/2011 Document Revised: 01/05/2013 Document Reviewed: 11/04/2012 [facility] Patient Information 2015 [place], [place]. This information is not intended to replace advice given to you by your health care provider. Make sure you discuss any questions you have with your health care provider.  Gastroesophageal Reflux Disease, Adult Gastroesophageal reflux disease (GERD) happens when acid from your stomach flows up into the esophagus. When acid comes in contact with the esophagus, the acid causes  soreness (inflammation) in the esophagus. Over time, GERD may create small holes (ulcers) in the lining of the esophagus. CAUSES   Increased body weight. This puts pressure on the stomach, making acid rise from the stomach into the esophagus.  Smoking. This increases acid production in the stomach.  Drinking alcohol. This causes decreased pressure in the lower esophageal sphincter (valve or ring of muscle between the esophagus and stomach),  allowing acid from the stomach into the esophagus.  Late evening meals and a full stomach. This increases pressure and acid production in the stomach.  A malformed lower esophageal sphincter. Sometimes, no cause is found. SYMPTOMS   Burning pain in the lower part of the mid-chest behind the breastbone and in the mid-stomach area. This may occur twice a week or more often.  Trouble swallowing.  Sore throat.  Dry cough.  Asthma-like symptoms including chest tightness, shortness of breath, or wheezing. DIAGNOSIS  Your caregiver may be able to diagnose GERD based on your symptoms. In some cases, X-rays and other tests may be done to check for complications or to check the condition of your stomach and esophagus. TREATMENT  Your caregiver may recommend over-the-counter or prescription medicines to help decrease acid production. Ask your caregiver before starting or adding any new medicines.  HOME CARE INSTRUCTIONS   Change the factors that you can control. Ask your caregiver for guidance concerning weight loss, quitting smoking, and alcohol consumption.  Avoid foods and drinks that make your symptoms worse, such as:  Caffeine or alcoholic drinks.  Chocolate.  Peppermint or mint flavorings.  Garlic and onions.  Spicy foods.  Citrus fruits, such as oranges, lemons, or limes.  Tomato-based foods such as sauce, chili, salsa, and pizza.  Fried and fatty foods.  Avoid lying down for the 3 hours prior to your bedtime or prior to taking a nap.  Eat small, frequent meals instead of large meals.  Wear loose-fitting clothing. Do not wear anything tight around your waist that causes pressure on your stomach.  Raise the head of your bed 6 to 8 inches with wood blocks to help you sleep. Extra pillows will not help.  Only take over-the-counter or prescription medicines for pain, discomfort, or fever as directed by your caregiver.  Do not take aspirin, ibuprofen, or other nonsteroidal  anti-inflammatory drugs (NSAIDs). SEEK IMMEDIATE MEDICAL CARE IF:   You have pain in your arms, neck, jaw, teeth, or back.  Your pain increases or changes in intensity or duration.  You develop nausea, vomiting, or sweating (diaphoresis).  You develop shortness of breath, or you faint.  Your vomit is green, yellow, black, or looks like coffee grounds or blood.  Your stool is red, bloody, or black. These symptoms could be signs of other problems, such as heart disease, gastric bleeding, or esophageal bleeding. MAKE SURE YOU:   Understand these instructions.  Will watch your condition.  Will get help right away if you are not doing well or get worse. Document Released: 10/10/2004 Document Revised: 03/25/2011 Document Reviewed: 07/20/2010 [facility] Patient Information 2015 [place], [place]. This information is not intended to replace advice given to you by your health care provider. Make sure you discuss any questions you have with your health care provider.

## 2015-03-14 ENCOUNTER — Ambulatory Visit (INDEPENDENT_AMBULATORY_CARE_PROVIDER_SITE_OTHER): Payer: Managed Care, Other (non HMO) | Admitting: Urgent Care

## 2015-03-14 VITALS — BP 118/74 | HR 58 | Temp 98.4°F | Resp 18 | Ht 71.25 in | Wt 206.4 lb

## 2015-03-14 DIAGNOSIS — R059 Cough, unspecified: Secondary | ICD-10-CM

## 2015-03-14 DIAGNOSIS — R6883 Chills (without fever): Secondary | ICD-10-CM | POA: Diagnosis not present

## 2015-03-14 DIAGNOSIS — R05 Cough: Secondary | ICD-10-CM

## 2015-03-14 DIAGNOSIS — R61 Generalized hyperhidrosis: Secondary | ICD-10-CM

## 2015-03-14 DIAGNOSIS — R0981 Nasal congestion: Secondary | ICD-10-CM | POA: Diagnosis not present

## 2015-03-14 MED ORDER — BENZONATATE 100 MG PO CAPS: 100.0000 mg | ORAL_CAPSULE | Freq: Three times a day (TID) | ORAL | Status: DC | PRN

## 2015-03-14 MED ORDER — PSEUDOEPHEDRINE HCL ER 120 MG PO TB12: 120.0000 mg | ORAL_TABLET | Freq: Two times a day (BID) | ORAL | Status: DC

## 2015-03-14 MED ORDER — HYDROCODONE-HOMATROPINE 5-1.5 MG/5ML PO SYRP: 5.0000 mL | ORAL_SOLUTION | Freq: Every evening | ORAL | Status: DC | PRN

## 2015-03-14 MED ORDER — CETIRIZINE HCL 10 MG PO TABS: 10.0000 mg | ORAL_TABLET | Freq: Every day | ORAL | Status: DC

## 2015-03-14 NOTE — Progress Notes (Signed)
    MRN: [id number] DOB: August 12, 1970  Subjective:   Andrew Mcbride is a 45 y.o. male presenting for chief complaint of Chills; Hot Flashes; and Generalized Body Aches  Reports 4 day history of chills, subjective fever, body aches, productive cough, sore throat, sinus congestion. He has tried otc medications including NyQuil with minimal relief. He does not take flu shots. Denies sinus pain, ear pain, ear drainage, chest pain, shob, n/v, abdominal pain.   Andrew Mcbride has a current medication list which includes the following prescription(s): lorazepam and omeprazole. Also has No Known Allergies.  Andrew Mcbride  has a past medical history of Chronic leg pain; Chronic back pain; Chest pain; Palpitation; and Anxiety. Also  has no past surgical history on file.  Objective:   Vitals: BP 118/74 mmHg  Pulse 58  Temp(Src) 98.4 F (36.9 C) (Oral)  Resp 18  Ht 5' 11.25" (1.81 m)  Wt 206 lb 6.4 oz (93.622 kg)  BMI 28.58 kg/m2  SpO2 95%  Physical Exam  Constitutional: He is oriented to person, place, and time. He appears well-developed and well-nourished.  HENT:  TM's intact bilaterally, no effusions or erythema. Nasal turbinates slightly erythematous with clear mucus. No sinus tenderness. Mild postnasal drip present, without oropharyngeal exudates, erythema or abscesses.  Eyes: Right eye exhibits no discharge. Left eye exhibits no discharge. No scleral icterus.  Neck: Normal range of motion. Neck supple.  Cardiovascular: Normal rate, regular rhythm and intact distal pulses.  Exam reveals no gallop and no friction rub.   No murmur heard. Pulmonary/Chest: No respiratory distress. He has no wheezes. He has no rales.  Lymphadenopathy:    He has no cervical adenopathy.  Neurological: He is alert and oriented to person, place, and time.  Skin: Skin is warm and dry.   Assessment and Plan :   1. Cough 2. Nasal congestion 3. Chills 4. Night sweats - Patient likely has influenza but patient declined testing.  Advised supportive care, Hycodan and Tessalon for cough and sore throat. Zyrtec and Sudafed for nasal congestion and post-nasal drainage. RTC in 1 week if no improvement.  Andrew Bamberg, PA-C [facility] [phone] 03/14/2015 5:01 PM

## 2015-03-14 NOTE — Patient Instructions (Signed)
Upper Respiratory Infection, Adult Most upper respiratory infections (URIs) are a viral infection of the air passages leading to the lungs. A URI affects the nose, throat, and upper air passages. The most common type of URI is nasopharyngitis and is typically referred to as "the common cold." URIs run their course and usually go away on their own. Most of the time, a URI does not require medical attention, but sometimes a bacterial infection in the upper airways can follow a viral infection. This is called a secondary infection. Sinus and middle ear infections are common types of secondary upper respiratory infections. Bacterial pneumonia can also complicate a URI. A URI can worsen asthma and chronic obstructive pulmonary disease (COPD). Sometimes, these complications can require emergency medical care and may be life threatening.  CAUSES Almost all URIs are caused by viruses. A virus is a type of germ and can spread from one person to another.  RISKS FACTORS You may be at risk for a URI if:   You smoke.   You have chronic heart or lung disease.  You have a weakened defense (immune) system.   You are very young or very old.   You have nasal allergies or asthma.  You work in crowded or poorly ventilated areas.  You work in health care facilities or schools. SIGNS AND SYMPTOMS  Symptoms typically develop 2-3 days after you come in contact with a cold virus. Most viral URIs last 7-10 days. However, viral URIs from the influenza virus (flu virus) can last 14-18 days and are typically more severe. Symptoms may include:   Runny or stuffy (congested) nose.   Sneezing.   Cough.   Sore throat.   Headache.   Fatigue.   Fever.   Loss of appetite.   Pain in your forehead, behind your eyes, and over your cheekbones (sinus pain).  Muscle aches.  DIAGNOSIS  Your health care provider may diagnose a URI by:  Physical exam.  Tests to check that your symptoms are not due to  another condition such as:  Strep throat.  Sinusitis.  Pneumonia.  Asthma. TREATMENT  A URI goes away on its own with time. It cannot be cured with medicines, but medicines may be prescribed or recommended to relieve symptoms. Medicines may help:  Reduce your fever.  Reduce your cough.  Relieve nasal congestion. HOME CARE INSTRUCTIONS   Take medicines only as directed by your health care provider.   Gargle warm saltwater or take cough drops to comfort your throat as directed by your health care provider.  Use a warm mist humidifier or inhale steam from a shower to increase air moisture. This may make it easier to breathe.  Drink enough fluid to keep your urine clear or pale yellow.   Eat soups and other clear broths and maintain good nutrition.   Rest as needed.   Return to work when your temperature has returned to normal or as your health care provider advises. You may need to stay home longer to avoid infecting others. You can also use a face mask and careful hand washing to prevent spread of the virus.  Increase the usage of your inhaler if you have asthma.   Do not use any tobacco products, including cigarettes, chewing tobacco, or electronic cigarettes. If you need help quitting, ask your health care provider. PREVENTION  The best way to protect yourself from getting a cold is to practice good hygiene.   Avoid oral or hand contact with people with cold   symptoms.   Wash your hands often if contact occurs.  There is no clear evidence that vitamin C, vitamin E, echinacea, or exercise reduces the chance of developing a cold. However, it is always recommended to get plenty of rest, exercise, and practice good nutrition.  SEEK MEDICAL CARE IF:   You are getting worse rather than better.   Your symptoms are not controlled by medicine.   You have chills.  You have worsening shortness of breath.  You have brown or red mucus.  You have yellow or brown nasal  discharge.  You have pain in your face, especially when you bend forward.  You have a fever.  You have swollen neck glands.  You have pain while swallowing.  You have white areas in the back of your throat. SEEK IMMEDIATE MEDICAL CARE IF:   You have severe or persistent:  Headache.  Ear pain.  Sinus pain.  Chest pain.  You have chronic lung disease and any of the following:  Wheezing.  Prolonged cough.  Coughing up blood.  A change in your usual mucus.  You have a stiff neck.  You have changes in your:  Vision.  Hearing.  Thinking.  Mood. MAKE SURE YOU:   Understand these instructions.  Will watch your condition.  Will get help right away if you are not doing well or get worse.   This information is not intended to replace advice given to you by your health care provider. Make sure you discuss any questions you have with your health care provider.   Document Released: 06/26/2000 Document Revised: 05/17/2014 Document Reviewed: 04/07/2013 Elsevier Interactive Patient Education 2016 [employer]  

## 2016-02-16 IMAGING — CT CT ABD-PELV W/O CM
2 of 4 series · 5 of 46 positions shown, 7 images · non-contrast
Comparison: Pelvis radiograph performed 01/27/2014

CLINICAL DATA: Acute onset of right flank pain and left-sided
abdominal pain. Initial encounter.

EXAM:
CT ABDOMEN AND PELVIS WITHOUT CONTRAST
TECHNIQUE: Multidetector CT imaging of the abdomen and pelvis was performed
following the standard protocol without IV contrast.

[Series 204: coronal · coronal · 0.50mm/px · 4 of 123 slices shown, 5 images]
[im 28/123  soft-tissue]
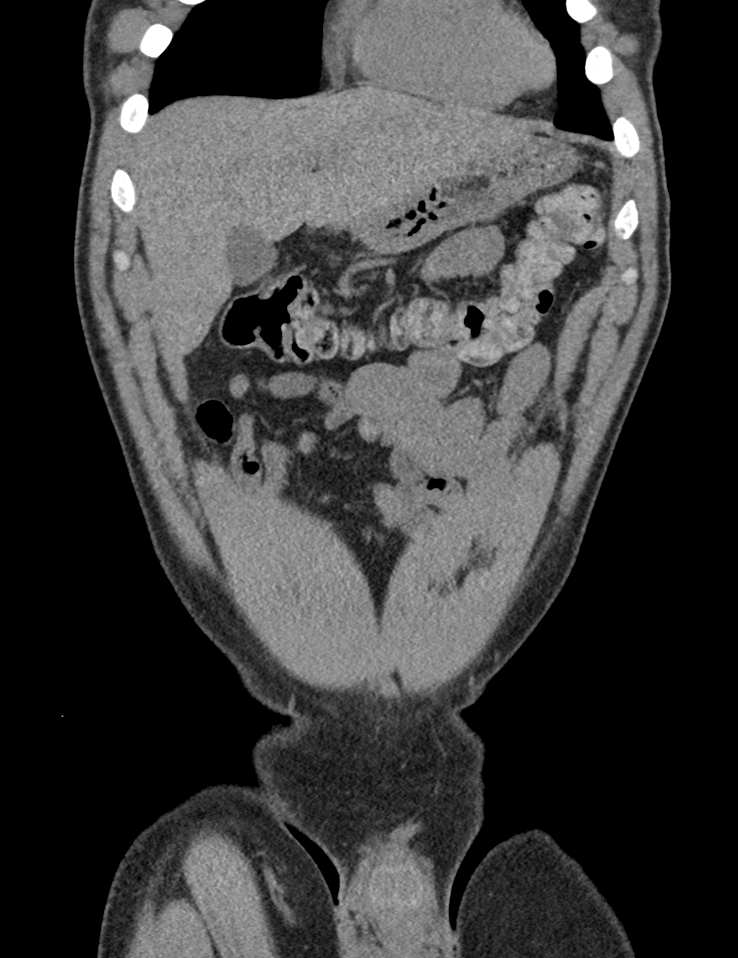
[im 28/123  bone]
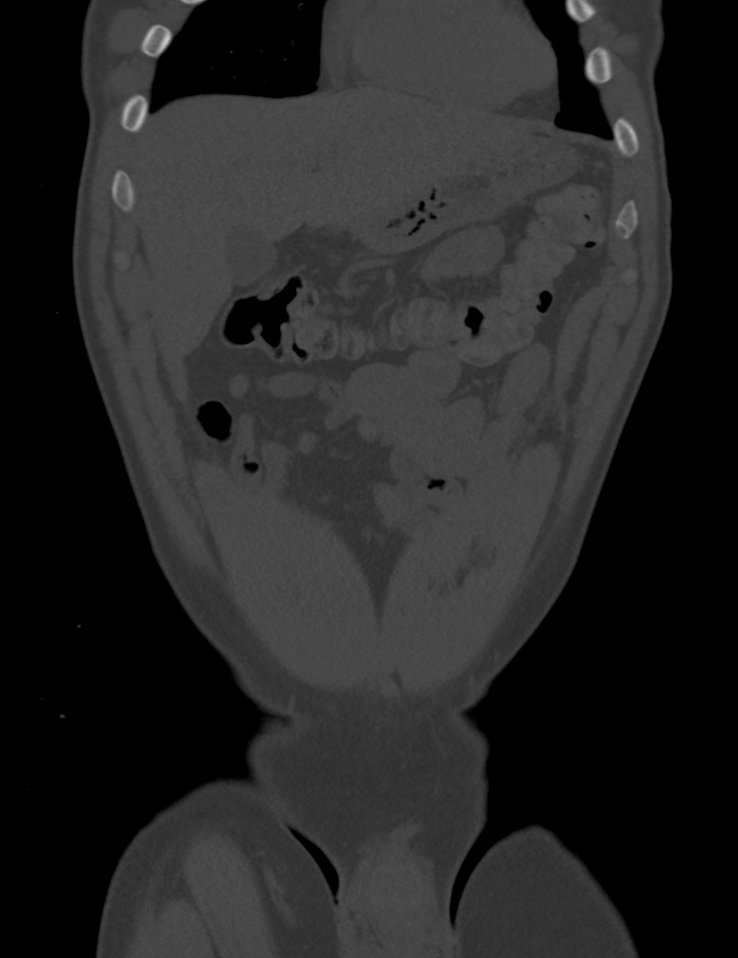
[im 55/123  soft-tissue]
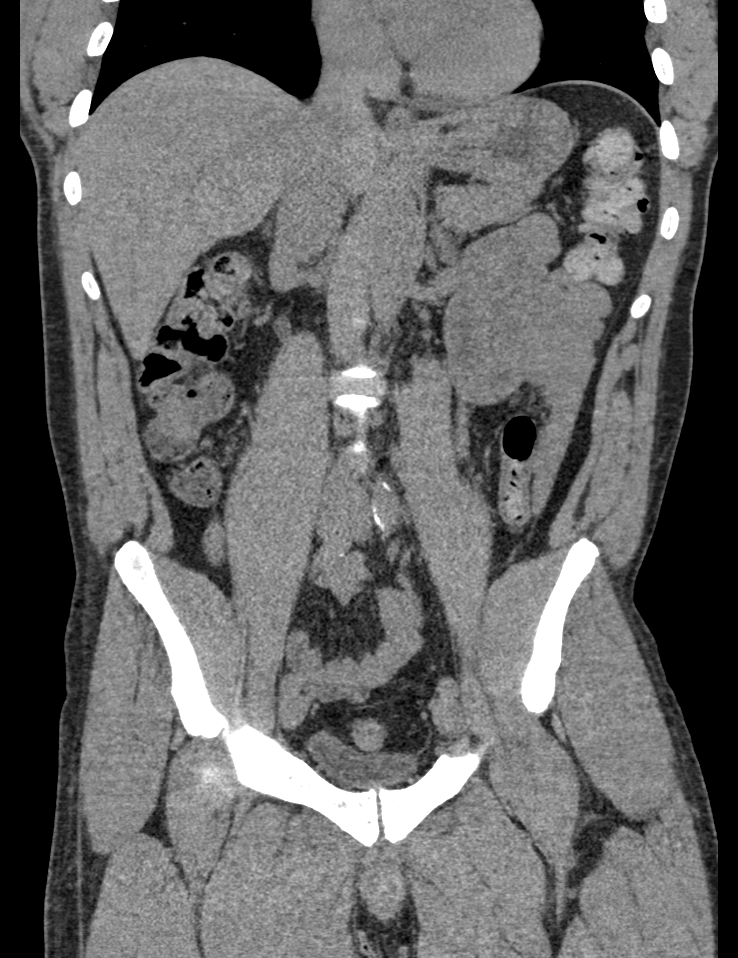
[im 82/123  soft-tissue]
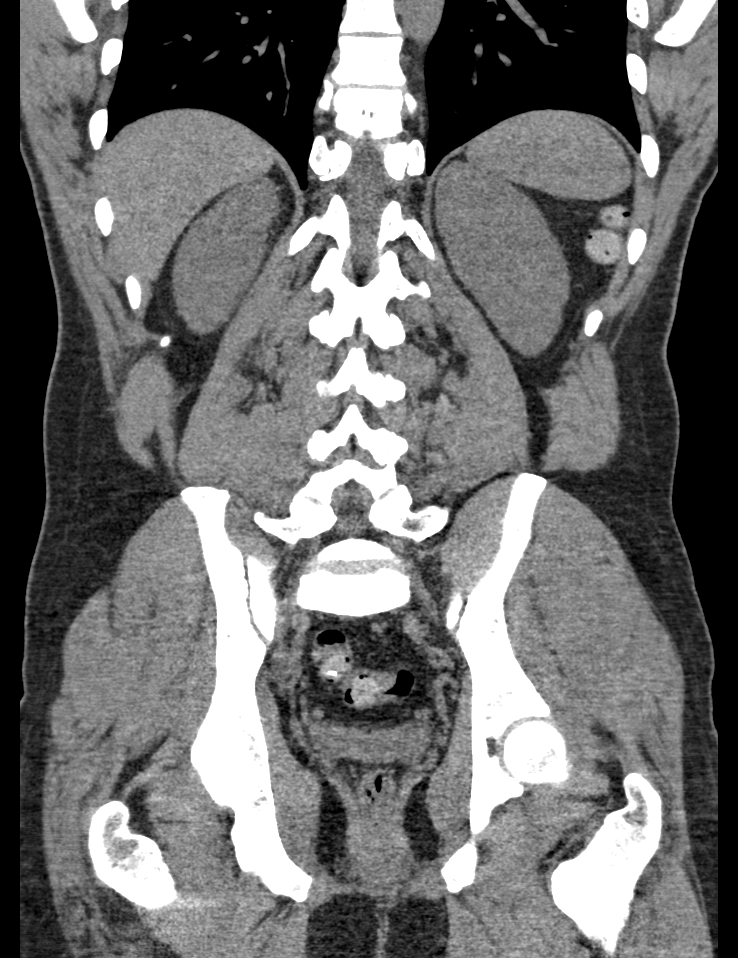
[im 109/123  soft-tissue]
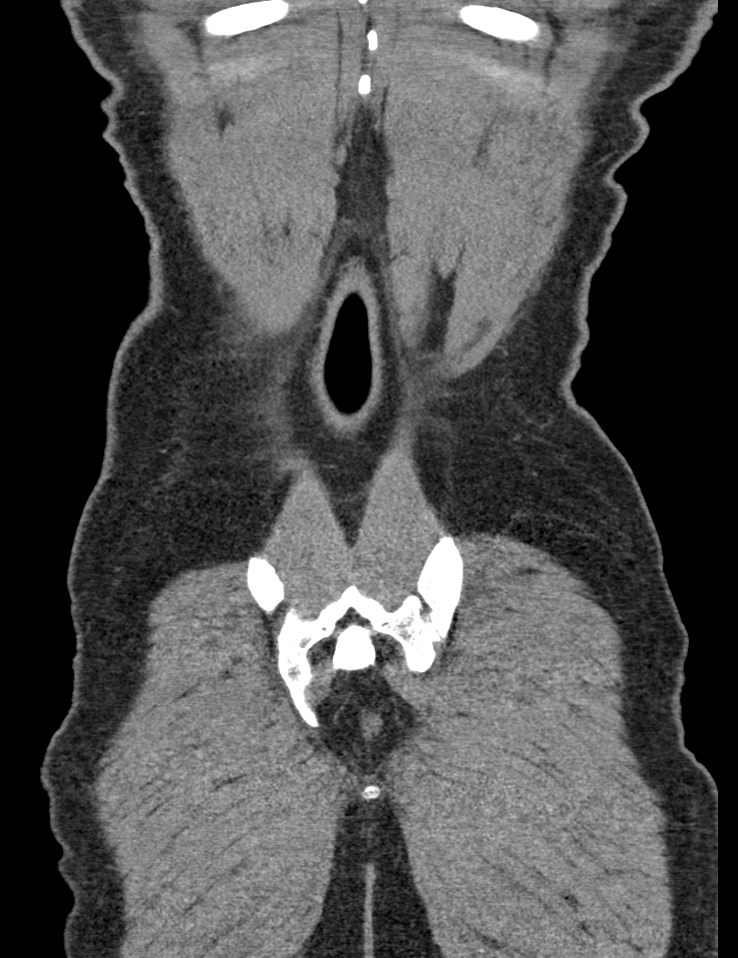

[Series 205: sag · sagittal · 0.50mm/px · 1 of 162 slices shown, 2 images]
[im 54/162  soft-tissue]
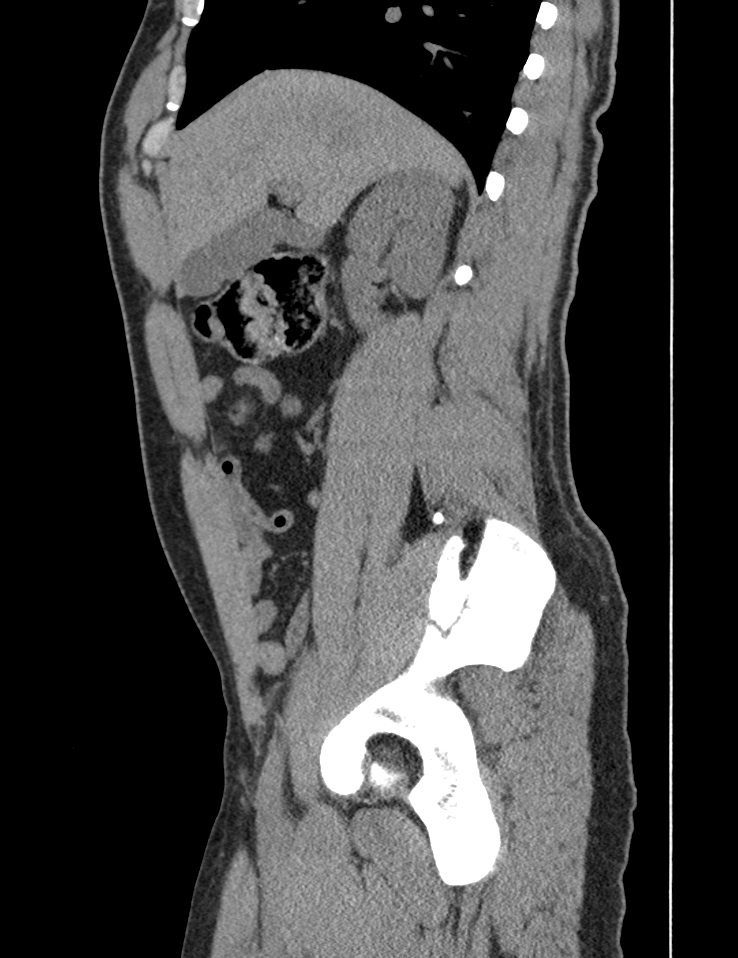
[im 54/162  bone]
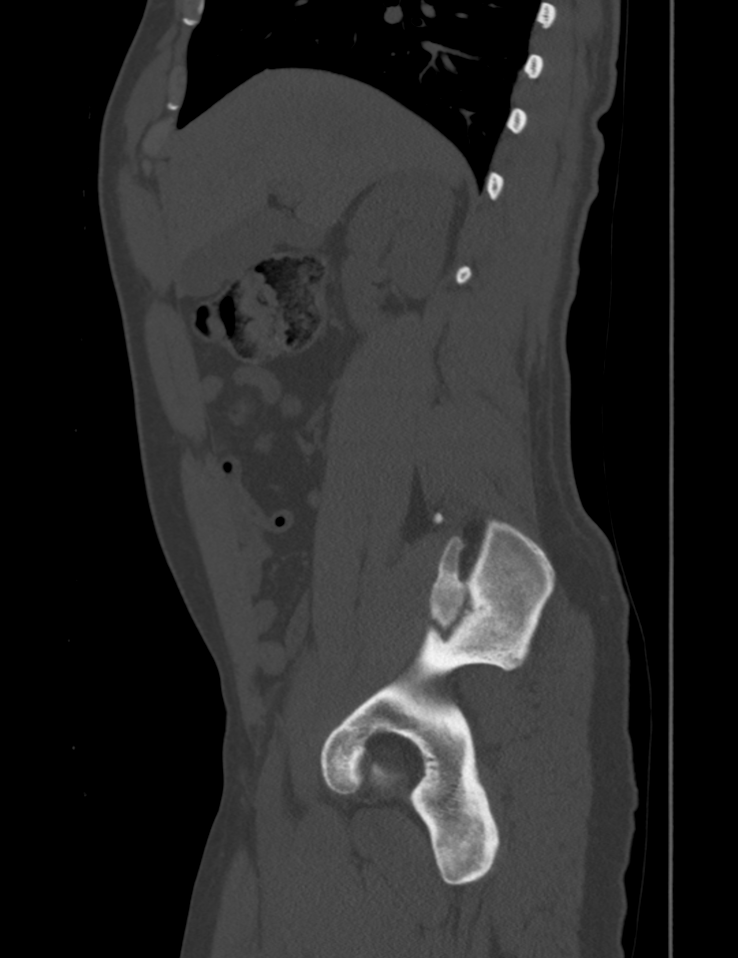

[5 of 46 positions shown; findings below may reference images not displayed]

FINDINGS: The visualized lung bases are clear.

Minimal vaguely decreased attenuation is suggested within the right
hepatic lobe, though this could reflect normal vasculature. Would
correlate with LFTs. The spleen is unremarkable in appearance.

The gallbladder is within normal limits. The pancreas and adrenal
glands are unremarkable.

The kidneys are unremarkable in appearance. There is no evidence of
hydronephrosis. No renal or ureteral stones are seen. No perinephric
stranding is appreciated.

No free fluid is identified. The small bowel is unremarkable in
appearance. The stomach is within normal limits. No acute vascular
abnormalities are seen. Minimal calcification is noted at the distal
abdominal aorta and common iliac arteries bilaterally.

The appendix is normal in caliber, without evidence for
appendicitis. The colon is unremarkable.

The bladder is decompressed and grossly unremarkable in appearance.
The prostate remains normal in size. No inguinal lymphadenopathy is
seen.

No acute osseous abnormalities are identified.
IMPRESSION: 1. No acute abnormality seen to explain the patient's symptoms.
2. Minimal vaguely decreased attenuation suggested within the right
hepatic lobe, though this could simply reflect normal vasculature.
Would correlate with LFTs.

## 2016-09-22 IMAGING — CR DG CHEST 2V
2 series · 2 of 2 positions shown · non-contrast
Comparison: 04/08/2014

CLINICAL DATA: Left-sided non radiating chest pain, onset tonight
after eating pizza.

EXAM:
CHEST  2 VIEW

[w chest pa]
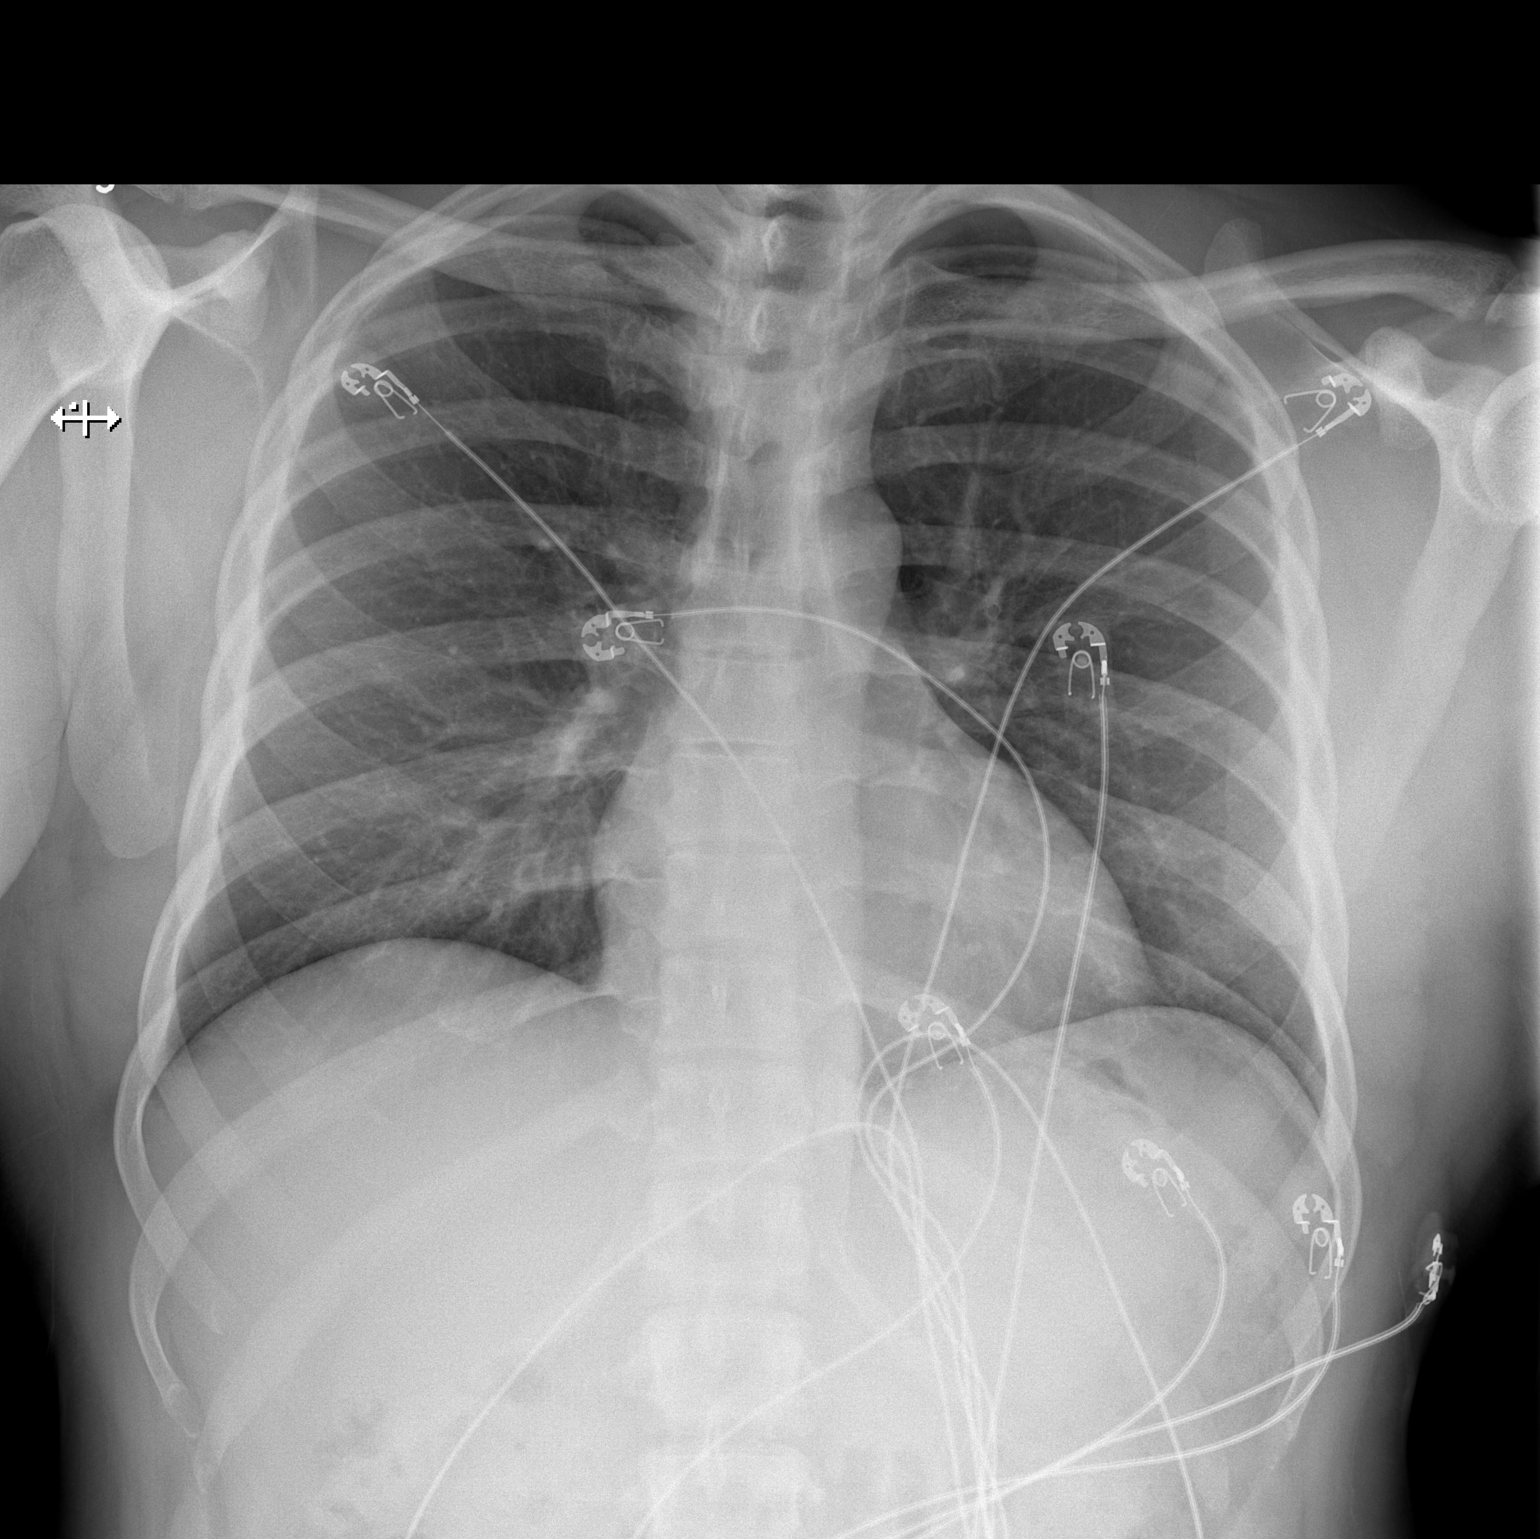

[w chest lat]
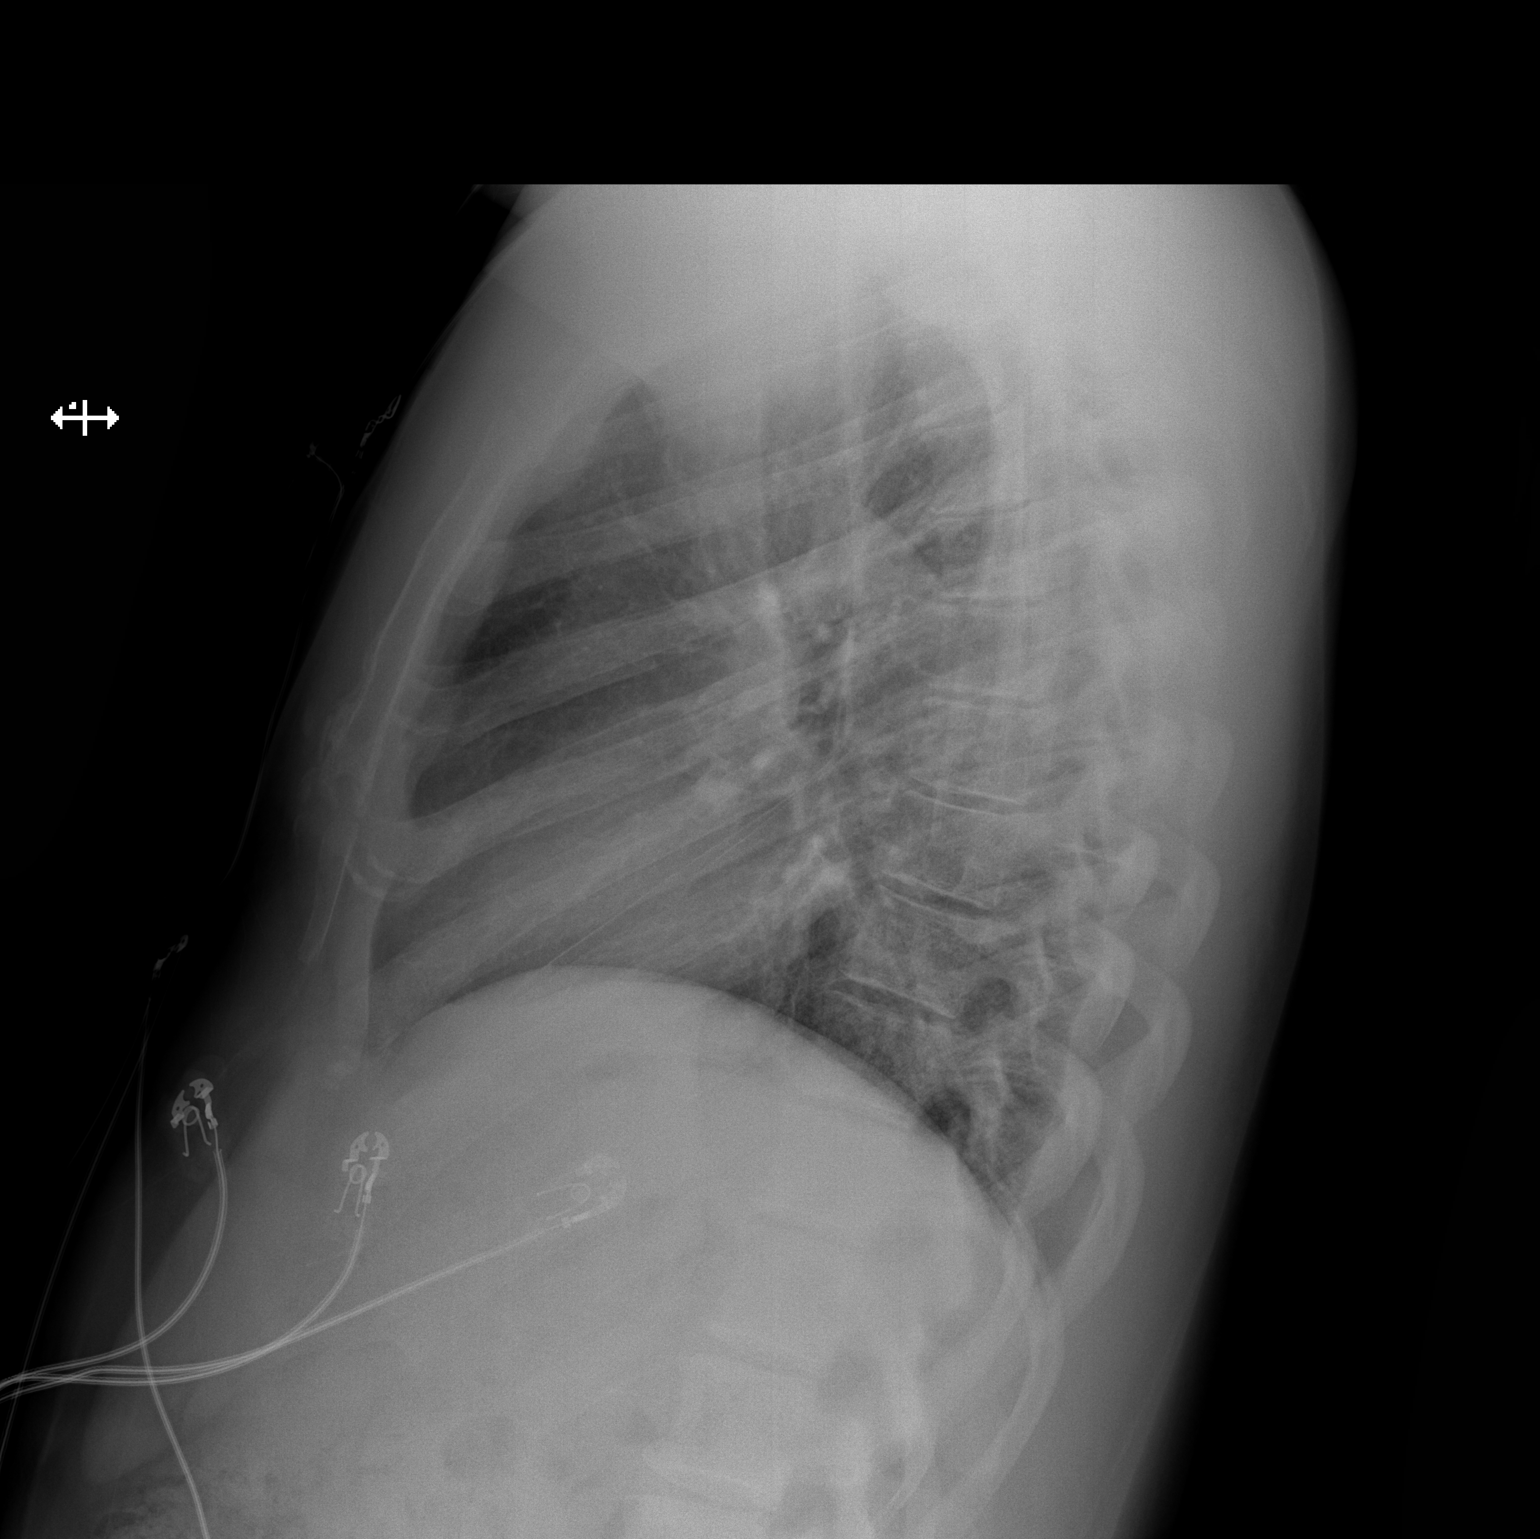

[2 of 2 positions shown; findings below may reference images not displayed]

FINDINGS: The heart size and mediastinal contours are within normal limits.
Both lungs are clear. The visualized skeletal structures are
unremarkable.
IMPRESSION: No active cardiopulmonary disease.

## 2017-04-11 ENCOUNTER — Encounter: Payer: Self-pay | Admitting: Podiatry

## 2017-04-11 ENCOUNTER — Ambulatory Visit: Payer: Managed Care, Other (non HMO) | Admitting: Podiatry

## 2017-04-11 DIAGNOSIS — Q828 Other specified congenital malformations of skin: Secondary | ICD-10-CM

## 2017-04-11 DIAGNOSIS — M79671 Pain in right foot: Secondary | ICD-10-CM | POA: Diagnosis not present

## 2017-04-11 DIAGNOSIS — M79672 Pain in left foot: Secondary | ICD-10-CM

## 2017-04-11 NOTE — Progress Notes (Signed)
Subjective: I will presents the office today for concerns of recurrent painful calluses the ball of both of his feet on submetatarsal 5 area.  For the last saw him he seen a couple other physicians but he came back for further treatment.  He states he saw another doctor and they put a steroid injection which did not help he also had a very hard orthotic made which was not comfortable for him.  He denies any redness or drainage or any swelling.  He has no other concerns. Denies any systemic complaints such as fevers, chills, nausea, vomiting. No acute changes since last appointment, and no other complaints at this time.   Objective: AAO x3, NAD DP/PT pulses palpable bilaterally, CRT less than 3 seconds Protective sensation intact with Simms Weinstein monofilament Deep punctate annular hyperkeratotic lesions present bilateral submetatarsal 5.  Upon debridement there is no underlying ulceration, drainage or any signs of infection present.  There is prominence of metatarsal head plantarly with atrophy of the fat pad.  No other area tenderness. No open lesions or pre-ulcerative lesions.  No pain with calf compression, swelling, warmth, erythema  Assessment: Porokeratosis bilaterally submetatarsal 5  Plan: -All treatment options discussed with the patient including all alternatives, risks, complications.  -Lesions were sharply debrided x2 without any complications or bleeding.  Offloading pads were dispensed.  Discussed with him a more accommodative orthotic to help offload submetatarsal 5.  We will check insurance coverage for this and help him follow-up with [employer]if[employer] Nobleick for molding.  Also discussed surgical intervention if symptoms continue. -Patient encouraged to call the office with any questions, concerns, change in symptoms.   Vivi BarrackMatthew R Wagoner DPM

## 2017-04-15 ENCOUNTER — Other Ambulatory Visit: Payer: Managed Care, Other (non HMO) | Admitting: Orthotics

## 2017-12-24 ENCOUNTER — Ambulatory Visit: Payer: Managed Care, Other (non HMO) | Admitting: Podiatry

## 2017-12-26 ENCOUNTER — Ambulatory Visit: Payer: Managed Care, Other (non HMO) | Admitting: Podiatry

## 2017-12-26 DIAGNOSIS — Q828 Other specified congenital malformations of skin: Secondary | ICD-10-CM | POA: Diagnosis not present

## 2017-12-26 NOTE — Patient Instructions (Addendum)
Onychomycosis/Fungal Toenails  WHAT IS IT? An infection that lies within the keratin of your nail plate that is caused by a fungus.  WHY ME? Fungal infections affect all ages, sexes, races, and creeds.  There may be many factors that predispose you to a fungal infection such as age, coexisting medical conditions such as diabetes, or an autoimmune disease; stress, medications, fatigue, genetics, etc.  Bottom line: fungus thrives in a warm, moist environment and your shoes offer such a location.  IS IT CONTAGIOUS? Theoretically, yes.  You do not want to share shoes, nail clippers or files with someone who has fungal toenails.  Walking around barefoot in the same room or sleeping in the same bed is unlikely to transfer the organism.  It is important to realize, however, that fungus can spread easily from one nail to the next on the same foot.  HOW DO WE TREAT THIS?  There are several ways to treat this condition.  Treatment may depend on many factors such as age, medications, pregnancy, liver and kidney conditions, etc.  It is best to ask your doctor which options are available to you.  1. No treatment.   Unlike many other medical concerns, you can live with this condition.  However for many people this can be a painful condition and may lead to ingrown toenails or a bacterial infection.  It is recommended that you keep the nails cut short to help reduce the amount of fungal nail. 2. Topical treatment.  These range from herbal remedies to prescription strength nail lacquers.  About 40-50% effective, topicals require twice daily application for approximately 9 to 12 months or until an entirely new nail has grown out.  The most effective topicals are medical grade medications available through physicians offices. 3. Oral antifungal medications.  With an 80-90% cure rate, the most common oral medication requires 3 to 4 months of therapy and stays in your system for a year as the new nail grows out.  Oral  antifungal medications do require blood work to make sure it is a safe drug for you.  A liver function panel will be performed prior to starting the medication and after the first month of treatment.  It is important to have the blood work performed to avoid any harmful side effects.  In general, this medication safe but blood work is required. 4. Laser Therapy.  This treatment is performed by applying a specialized laser to the affected nail plate.  This therapy is noninvasive, fast, and non-painful.  It is not covered by insurance and is therefore, out of pocket.  The results have been very good with a 80-95% cure rate.  [facility] is the only practice in the area to offer this therapy. 5. Permanent Nail Avulsion.  Removing the entire nail so that a new nail will not grow back.  Corns and Calluses Corns are small areas of thickened skin that occur on the top, sides, or tip of a toe. They contain a cone-shaped core with a point that can press on a nerve below. This causes pain. Calluses are areas of thickened skin that can occur anywhere on the body including hands, fingers, palms, soles of the feet, and heels.Calluses are usually larger than corns. What are the causes? Corns and calluses are caused by rubbing (friction) or pressure, such as from shoes that are too tight or do not fit properly. What increases the risk? Corns are more likely to develop in people who have toe   deformities, such as hammer toes. Since calluses can occur with friction to any area of the skin, calluses are more likely to develop in people who:  Work with their hands.  Wear shoes that fit poorly, shoes that are too tight, or shoes that are high-heeled.  Have toes deformities.  What are the signs or symptoms? Symptoms of a corn or callus include:  A hard growth on the skin.  Pain or tenderness under the skin.  Redness and swelling.  Increased discomfort while wearing tight-fitting shoes.  How is this  diagnosed? Corns and calluses may be diagnosed with a medical history and physical exam. How is this treated? Corns and calluses may be treated with:  Removing the cause of the friction or pressure. This may include: ? Changing your shoes. ? Wearing shoe inserts (orthotics) or other protective layers in your shoes, such as a corn pad. ? Wearing gloves.  Medicines to help soften skin in the hardened, thickened areas.  Reducing the size of the corn or callus by removing the dead layers of skin.  Antibiotic medicines to treat infection.  Surgery, if a toe deformity is the cause.  Follow these instructions at home:  Take medicines only as directed by your health care provider.  If you were prescribed an antibiotic, finish all of it even if you start to feel better.  Wear shoes that fit well. Avoid wearing high-heeled shoes and shoes that are too tight or too loose.  Wear any padding, protective layers, gloves, or orthotics as directed by your health care provider.  Soak your hands or feet and then use a file or pumice stone to soften your corn or callus. Do this as directed by your health care provider.  Check your corn or callus every day for signs of infection. Watch for: ? Redness, swelling, or pain. ? Fluid, blood, or pus. Contact a health care provider if:  Your symptoms do not improve with treatment.  You have increased redness, swelling, or pain at the site of your corn or callus.  You have fluid, blood, or pus coming from your corn or callus.  You have new symptoms. This information is not intended to replace advice given to you by your health care provider. Make sure you discuss any questions you have with your health care provider. Document Released: 10/07/2003 Document Revised: 07/21/2015 Document Reviewed: 12/27/2013 Elsevier Interactive Patient Education  2018 [employer]  

## 2018-01-29 ENCOUNTER — Encounter: Payer: Self-pay | Admitting: Podiatry

## 2018-01-29 NOTE — Progress Notes (Signed)
Subjective:  Patient presents to clinic with cc of  painful calluses both feet which are aggravated when weightbearing with and without shoe gear.  This pain limits his daily activities. Pain symptoms resolve with periodic professional debridement.  [name], MD is his PCP.   Current Outpatient Medications:  .  benzonatate (TESSALON) 100 MG capsule, Take 1-2 capsules (100-200 mg total) by mouth 3 (three) times daily as needed for cough., Disp: 40 capsule, Rfl: 0 .  cetirizine (ZYRTEC) 10 MG tablet, Take 1 tablet (10 mg total) by mouth daily., Disp: 30 tablet, Rfl: 11 .  HYDROcodone-homatropine (HYCODAN) 5-1.5 MG/5ML syrup, Take 5 mLs by mouth at bedtime as needed., Disp: 120 mL, Rfl: 0 .  LORazepam (ATIVAN) 1 MG tablet, Take 1 tablet (1 mg total) by mouth every 8 (eight) hours as needed for anxiety., Disp: 7 tablet, Rfl: 0 .  meloxicam (MOBIC) 15 MG tablet, TAKE 1 2 TO 1 (ONE HALF TO ONE) TABLET BY MOUTH ONCE DAILY AS NEEDED, Disp: , Rfl: 0 .  omeprazole (PRILOSEC) 40 MG capsule, Take 1 capsule (40 mg total) by mouth daily., Disp: 30 capsule, Rfl: 0 .  pantoprazole (PROTONIX) 40 MG tablet, TAKE 1 TABLET BY MOUTH ONCE DAILY FOR 90 DAYS, Disp: , Rfl: 1 .  pseudoephedrine (SUDAFED 12 HOUR) 120 MG 12 hr tablet, Take 1 tablet (120 mg total) by mouth 2 (two) times daily., Disp: 30 tablet, Rfl: 3   No Known Allergies   Objective:  Physical Examination: Neurovascular status intact b/l feet.  Muscle strength 5/5 to all muscle groups b/l LE  Porokeratotic lesion noted submetatarsal head 5 b/l with tenderness to palpation.  There is no erythema, no edema, no drainage, no flocculence noted with either lesion.  Assessment: Painful porokeratosis submetatarsal head 5 bilaterally   Plan: Painful porokeratotoc lesion pared submetatarsal head 5 bilaterally Continue soft, supportive shoe gear daily. Report any pedal injuries to medical professional Follow-up as needed Call our office should  there be any questions/concerns in the interim.

## 2018-03-27 ENCOUNTER — Ambulatory Visit: Payer: Managed Care, Other (non HMO) | Admitting: Podiatry

## 2018-03-31 ENCOUNTER — Ambulatory Visit: Payer: Managed Care, Other (non HMO) | Admitting: Podiatry

## 2018-03-31 ENCOUNTER — Other Ambulatory Visit: Payer: Self-pay

## 2018-03-31 DIAGNOSIS — L84 Corns and callosities: Secondary | ICD-10-CM

## 2018-03-31 DIAGNOSIS — B351 Tinea unguium: Secondary | ICD-10-CM | POA: Diagnosis not present

## 2018-03-31 DIAGNOSIS — Q828 Other specified congenital malformations of skin: Secondary | ICD-10-CM | POA: Diagnosis not present

## 2018-03-31 DIAGNOSIS — M79672 Pain in left foot: Secondary | ICD-10-CM

## 2018-03-31 DIAGNOSIS — M79671 Pain in right foot: Secondary | ICD-10-CM

## 2018-03-31 NOTE — Patient Instructions (Signed)
Corns and Calluses Corns are small areas of thickened skin that occur on the top, sides, or tip of a toe. They contain a cone-shaped core with a point that can press on a nerve below. This causes pain.  Calluses are areas of thickened skin that can occur anywhere on the body, including the hands, fingers, palms, soles of the feet, and heels. Calluses are usually larger than corns. What are the causes? Corns and calluses are caused by rubbing (friction) or pressure, such as from shoes that are too tight or do not fit properly. What increases the risk? Corns are more likely to develop in people who have misshapen toes (toe deformities), such as hammer toes. Calluses can occur with friction to any area of the skin. They are more likely to develop in people who:  Work with their hands.  Wear shoes that fit poorly, are too tight, or are high-heeled.  Have toe deformities. What are the signs or symptoms? Symptoms of a corn or callus include:  A hard growth on the skin.  Pain or tenderness under the skin.  Redness and swelling.  Increased discomfort while wearing tight-fitting shoes, if your feet are affected. If a corn or callus becomes infected, symptoms may include:  Redness and swelling that gets worse.  Pain.  Fluid, blood, or pus draining from the corn or callus. How is this diagnosed? Corns and calluses may be diagnosed based on your symptoms, your medical history, and a physical exam. How is this treated? Treatment for corns and calluses may include:  Removing the cause of the friction or pressure. This may involve: ? Changing your shoes. ? Wearing shoe inserts (orthotics) or other protective layers in your shoes, such as a corn pad. ? Wearing gloves.  Applying medicine to the skin (topical medicine) to help soften skin in the hardened, thickened areas.  Removing layers of dead skin with a file to reduce the size of the corn or callus.  Removing the corn or callus with a  scalpel or laser.  Taking antibiotic medicines, if your corn or callus is infected.  Having surgery, if a toe deformity is the cause. Follow these instructions at home:   Take over-the-counter and prescription medicines only as told by your health care provider.  If you were prescribed an antibiotic, take it as told by your health care provider. Do not stop taking it even if your condition starts to improve.  Wear shoes that fit well. Avoid wearing high-heeled shoes and shoes that are too tight or too loose.  Wear any padding, protective layers, gloves, or orthotics as told by your health care provider.  Soak your hands or feet and then use a file or pumice stone to soften your corn or callus. Do this as told by your health care provider.  Check your corn or callus every day for symptoms of infection. Contact a health care provider if you:  Notice that your symptoms do not improve with treatment.  Have redness or swelling that gets worse.  Notice that your corn or callus becomes painful.  Have fluid, blood, or pus coming from your corn or callus.  Have new symptoms. Summary  Corns are small areas of thickened skin that occur on the top, sides, or tip of a toe.  Calluses are areas of thickened skin that can occur anywhere on the body, including the hands, fingers, palms, and soles of the feet. Calluses are usually larger than corns.  Corns and calluses are caused by   rubbing (friction) or pressure, such as from shoes that are too tight or do not fit properly.  Treatment may include wearing any padding, protective layers, gloves, or orthotics as told by your health care provider. This information is not intended to replace advice given to you by your health care provider. Make sure you discuss any questions you have with your health care provider. Document Released: 10/07/2003 Document Revised: 11/13/2016 Document Reviewed: 11/13/2016 Elsevier Interactive Patient Education   2019 [employer]  Onychomycosis/Fungal Toenails  WHAT IS IT? An infection that lies within the keratin of your nail plate that is caused by a fungus.  WHY ME? Fungal infections affect all ages, sexes, races, and creeds.  There may be many factors that predispose you to a fungal infection such as age, coexisting medical conditions such as diabetes, or an autoimmune disease; stress, medications, fatigue, genetics, etc.  Bottom line: fungus thrives in a warm, moist environment and your shoes offer such a location.  IS IT CONTAGIOUS? Theoretically, yes.  You do not want to share shoes, nail clippers or files with someone who has fungal toenails.  Walking around barefoot in the same room or sleeping in the same bed is unlikely to transfer the organism.  It is important to realize, however, that fungus can spread easily from one nail to the next on the same foot.  HOW DO WE TREAT THIS?  There are several ways to treat this condition.  Treatment may depend on many factors such as age, medications, pregnancy, liver and kidney conditions, etc.  It is best to ask your doctor which options are available to you.  1. No treatment.   Unlike many other medical concerns, you can live with this condition.  However for many people this can be a painful condition and may lead to ingrown toenails or a bacterial infection.  It is recommended that you keep the nails cut short to help reduce the amount of fungal nail. 2. Topical treatment.  These range from herbal remedies to prescription strength nail lacquers.  About 40-50% effective, topicals require twice daily application for approximately 9 to 12 months or until an entirely new nail has grown out.  The most effective topicals are medical grade medications available through physicians offices. 3. Oral antifungal medications.  With an 80-90% cure rate, the most common oral medication requires 3 to 4 months of therapy and stays in your system for a year as the new nail  grows out.  Oral antifungal medications do require blood work to make sure it is a safe drug for you.  A liver function panel will be performed prior to starting the medication and after the first month of treatment.  It is important to have the blood work performed to avoid any harmful side effects.  In general, this medication safe but blood work is required. 4. Laser Therapy.  This treatment is performed by applying a specialized laser to the affected nail plate.  This therapy is noninvasive, fast, and non-painful.  It is not covered by insurance and is therefore, out of pocket.  The results have been very good with a 80-95% cure rate.  [facility] is the only practice in the area to offer this therapy. 5. Permanent Nail Avulsion.  Removing the entire nail so that a new nail will not grow back. 

## 2018-04-08 ENCOUNTER — Encounter: Payer: Self-pay | Admitting: Podiatry

## 2018-04-08 NOTE — Progress Notes (Signed)
Subjective: Andrew Mcbride presents for follow up for painful calluses both feet which are aggravated when weightbearing with and without shoe gear.  This pain limits his daily activities. Pain symptoms resolve with periodic professional debridement.  Patient concerned today about discolored toenails b/l great toes.  Andrew Has, MD is his PCP.   Current Outpatient Medications:  .  amoxicillin-clavulanate (AUGMENTIN) 875-125 MG tablet, , Disp: , Rfl:  .  benzonatate (TESSALON) 100 MG capsule, Take 1-2 capsules (100-200 mg total) by mouth 3 (three) times daily as needed for cough., Disp: 40 capsule, Rfl: 0 .  cetirizine (ZYRTEC) 10 MG tablet, Take 1 tablet (10 mg total) by mouth daily., Disp: 30 tablet, Rfl: 11 .  HYDROcodone-homatropine (HYCODAN) 5-1.5 MG/5ML syrup, Take 5 mLs by mouth at bedtime as needed., Disp: 120 mL, Rfl: 0 .  LORazepam (ATIVAN) 1 MG tablet, Take 1 tablet (1 mg total) by mouth every 8 (eight) hours as needed for anxiety., Disp: 7 tablet, Rfl: 0 .  meloxicam (MOBIC) 15 MG tablet, TAKE 1 2 TO 1 (ONE HALF TO ONE) TABLET BY MOUTH ONCE DAILY AS NEEDED, Disp: , Rfl: 0 .  omeprazole (PRILOSEC) 40 MG capsule, Take 1 capsule (40 mg total) by mouth daily., Disp: 30 capsule, Rfl: 0 .  ondansetron (ZOFRAN) 8 MG tablet, TAKE 1 TABLET BY MOUTH EVERY 8 HOURS AS NEEDED FOR NAUSEA AND VOMITING, Disp: , Rfl:  .  pantoprazole (PROTONIX) 40 MG tablet, TAKE 1 TABLET BY MOUTH ONCE DAILY FOR 90 DAYS, Disp: , Rfl: 1 .  pseudoephedrine (SUDAFED 12 HOUR) 120 MG 12 hr tablet, Take 1 tablet (120 mg total) by mouth 2 (two) times daily., Disp: 30 tablet, Rfl: 3   No Known Allergies   Objective:  Physical Examination: Neurovascular status intact b/l feet.  Muscle strength 5/5 to all muscle groups b/l LE  Toenails b/l are painful, elongated, discolored, dystrophic with subungual debris. Pain with dorsal palpation of nailplates. No erythema, no edema, no drainage noted.  Porokeratotic lesion noted  submetatarsal head 5 b/l with tenderness to palpation.  There is no erythema, no edema, no drainage, no flocculence noted with either lesion.  Hyperkeratotic lesion dorsal PIPJ left 5th digit. No erythema, no edema, no drainage, no flocculence noted.  Assessment: Painful porokeratosis submetatarsal head 5 bilaterally Onychomycosis  Corn left 5th digit   Plan: 1. Porokeratosis submetatarsal head 5 b/l pared and enucleated with sterile scalpel blade without incident. 2. Corn(s) pared left 5th digit utilizing sterile scalpel blade without incident. 3. For onychomycosis, discussed treatment options. He would like to start laser treatment. He will schedule with Nurse Clifton James at his convenience. 4. Continue soft, supportive shoe gear daily. 5. Report any pedal injuries to medical professional. 6. Follow-up  3 months. 7. Call our office should there be any questions/concerns in the interim.

## 2018-06-30 ENCOUNTER — Ambulatory Visit: Payer: Managed Care, Other (non HMO) | Admitting: Podiatry

## 2018-07-31 ENCOUNTER — Encounter: Payer: Self-pay | Admitting: Podiatry

## 2018-07-31 ENCOUNTER — Ambulatory Visit: Payer: Managed Care, Other (non HMO) | Admitting: Podiatry

## 2018-07-31 ENCOUNTER — Other Ambulatory Visit: Payer: Self-pay

## 2018-07-31 DIAGNOSIS — M79672 Pain in left foot: Secondary | ICD-10-CM | POA: Diagnosis not present

## 2018-07-31 DIAGNOSIS — M79675 Pain in left toe(s): Secondary | ICD-10-CM | POA: Diagnosis not present

## 2018-07-31 DIAGNOSIS — B351 Tinea unguium: Secondary | ICD-10-CM | POA: Diagnosis not present

## 2018-07-31 DIAGNOSIS — B353 Tinea pedis: Secondary | ICD-10-CM

## 2018-07-31 DIAGNOSIS — M79671 Pain in right foot: Secondary | ICD-10-CM

## 2018-07-31 DIAGNOSIS — M79674 Pain in right toe(s): Secondary | ICD-10-CM

## 2018-07-31 DIAGNOSIS — Q828 Other specified congenital malformations of skin: Secondary | ICD-10-CM | POA: Diagnosis not present

## 2018-07-31 MED ORDER — CASTELLANI PAINT MODIFIED 1.5 % EX LIQD: CUTANEOUS | 1 refills | Status: DC

## 2018-07-31 NOTE — Patient Instructions (Addendum)
Athlete's Foot  Athlete's foot (tinea pedis) is a fungal infection of the skin on your feet. It often occurs on the skin that is between or underneath the toes. It can also occur on the soles of your feet. The infection can spread from person to person (is contagious). It can also spread when a person's bare feet come in contact with the fungus on shower floors or on items such as shoes. What are the causes? This condition is caused by a fungus that grows in warm, moist places. You can get athlete's foot by sharing shoes, shower stalls, towels, and wet floors with someone who is infected. Not washing your feet or changing your socks often enough can also lead to athlete's foot. What increases the risk? This condition is more likely to develop in:  Men.  People who have a weak body defense system (immune system).  People who have diabetes.  People who use public showers, such as at a gym.  People who wear heavy-duty shoes, such as industrial or military shoes.  Seasons with warm, humid weather. What are the signs or symptoms? Symptoms of this condition include:  Itchy areas between your toes or on the soles of your feet.  White, flaky, or scaly areas between your toes or on the soles of your feet.  Very itchy small blisters between your toes or on the soles of your feet.  Small cuts in your skin. These cuts can become infected.  Thick or discolored toenails. How is this diagnosed? This condition may be diagnosed with a physical exam and a review of your medical history. Your health care provider may also take a skin or toenail sample to examine under a microscope. How is this treated? This condition is treated with antifungal medicines. These may be applied as powders, ointments, or creams. In severe cases, an oral antifungal medicine may be given. Follow these instructions at home: Medicines  Apply or take over-the-counter and prescription medicines only as told by your health  care provider.  Apply your antifungal medicine as told by your health care provider. Do not stop using the antifungal even if your condition improves. Foot care  Do not scratch your feet.  Keep your feet dry: ? Wear cotton or wool socks. Change your socks every day or if they become wet. ? Wear shoes that allow air to flow, such as sandals or canvas tennis shoes.  Wash and dry your feet, including the area between your toes. Also, wash and dry your feet: ? Every day or as told by your health care provider. ? After exercising. General instructions  Do not let others use towels, shoes, nail clippers, or other personal items that touch your feet.  Protect your feet by wearing sandals in wet areas, such as locker rooms and shared showers.  Keep all follow-up visits as told by your health care provider. This is important.  If you have diabetes, keep your blood sugar under control. Contact a health care provider if:  You have a fever.  You have swelling, soreness, warmth, or redness in your foot.  Your feet are not getting better with treatment.  Your symptoms get worse.  You have new symptoms. Summary  Athlete's foot (tinea pedis) is a fungal infection of the skin on your feet. It often occurs on skin that is between or underneath the toes.  This condition is caused by a fungus that grows in warm, moist places.  Symptoms include white, flaky, or scaly areas between   your toes or on the soles of your feet.  This condition is treated with antifungal medicines.  Keep your feet clean. Always dry them thoroughly. This information is not intended to replace advice given to you by your health care provider. Make sure you discuss any questions you have with your health care provider. Document Released: 12/29/1999 Document Revised: 12/26/2016 Document Reviewed: 10/21/2016 Elsevier Patient Education  2020 Elsevier Inc.  Corns and Calluses Corns are small areas of thickened skin  that occur on the top, sides, or tip of a toe. They contain a cone-shaped core with a point that can press on a nerve below. This causes pain.  Calluses are areas of thickened skin that can occur anywhere on the body, including the hands, fingers, palms, soles of the feet, and heels. Calluses are usually larger than corns. What are the causes? Corns and calluses are caused by rubbing (friction) or pressure, such as from shoes that are too tight or do not fit properly. What increases the risk? Corns are more likely to develop in people who have misshapen toes (toe deformities), such as hammer toes. Calluses can occur with friction to any area of the skin. They are more likely to develop in people who:  Work with their hands.  Wear shoes that fit poorly, are too tight, or are high-heeled.  Have toe deformities. What are the signs or symptoms? Symptoms of a corn or callus include:  A hard growth on the skin.  Pain or tenderness under the skin.  Redness and swelling.  Increased discomfort while wearing tight-fitting shoes, if your feet are affected. If a corn or callus becomes infected, symptoms may include:  Redness and swelling that gets worse.  Pain.  Fluid, blood, or pus draining from the corn or callus. How is this diagnosed? Corns and calluses may be diagnosed based on your symptoms, your medical history, and a physical exam. How is this treated? Treatment for corns and calluses may include:  Removing the cause of the friction or pressure. This may involve: ? Changing your shoes. ? Wearing shoe inserts (orthotics) or other protective layers in your shoes, such as a corn pad. ? Wearing gloves.  Applying medicine to the skin (topical medicine) to help soften skin in the hardened, thickened areas.  Removing layers of dead skin with a file to reduce the size of the corn or callus.  Removing the corn or callus with a scalpel or laser.  Taking antibiotic medicines, if your  corn or callus is infected.  Having surgery, if a toe deformity is the cause. Follow these instructions at home:   Take over-the-counter and prescription medicines only as told by your health care provider.  If you were prescribed an antibiotic, take it as told by your health care provider. Do not stop taking it even if your condition starts to improve.  Wear shoes that fit well. Avoid wearing high-heeled shoes and shoes that are too tight or too loose.  Wear any padding, protective layers, gloves, or orthotics as told by your health care provider.  Soak your hands or feet and then use a file or pumice stone to soften your corn or callus. Do this as told by your health care provider.  Check your corn or callus every day for symptoms of infection. Contact a health care provider if you:  Notice that your symptoms do not improve with treatment.  Have redness or swelling that gets worse.  Notice that your corn or callus becomes painful.    Have fluid, blood, or pus coming from your corn or callus.  Have new symptoms. Summary  Corns are small areas of thickened skin that occur on the top, sides, or tip of a toe.  Calluses are areas of thickened skin that can occur anywhere on the body, including the hands, fingers, palms, and soles of the feet. Calluses are usually larger than corns.  Corns and calluses are caused by rubbing (friction) or pressure, such as from shoes that are too tight or do not fit properly.  Treatment may include wearing any padding, protective layers, gloves, or orthotics as told by your health care provider. This information is not intended to replace advice given to you by your health care provider. Make sure you discuss any questions you have with your health care provider. Document Released: 10/07/2003 Document Revised: 04/22/2018 Document Reviewed: 11/13/2016 Elsevier Patient Education  2020 Elsevier Inc.  Onychomycosis/Fungal Toenails  WHAT IS IT? An  infection that lies within the keratin of your nail plate that is caused by a fungus.  WHY ME? Fungal infections affect all ages, sexes, races, and creeds.  There may be many factors that predispose you to a fungal infection such as age, coexisting medical conditions such as diabetes, or an autoimmune disease; stress, medications, fatigue, genetics, etc.  Bottom line: fungus thrives in a warm, moist environment and your shoes offer such a location.  IS IT CONTAGIOUS? Theoretically, yes.  You do not want to share shoes, nail clippers or files with someone who has fungal toenails.  Walking around barefoot in the same room or sleeping in the same bed is unlikely to transfer the organism.  It is important to realize, however, that fungus can spread easily from one nail to the next on the same foot.  HOW DO WE TREAT THIS?  There are several ways to treat this condition.  Treatment may depend on many factors such as age, medications, pregnancy, liver and kidney conditions, etc.  It is best to ask your doctor which options are available to you.  1. No treatment.   Unlike many other medical concerns, you can live with this condition.  However for many people this can be a painful condition and may lead to ingrown toenails or a bacterial infection.  It is recommended that you keep the nails cut short to help reduce the amount of fungal nail. 2. Topical treatment.  These range from herbal remedies to prescription strength nail lacquers.  About 40-50% effective, topicals require twice daily application for approximately 9 to 12 months or until an entirely new nail has grown out.  The most effective topicals are medical grade medications available through physicians offices. 3. Oral antifungal medications.  With an 80-90% cure rate, the most common oral medication requires 3 to 4 months of therapy and stays in your system for a year as the new nail grows out.  Oral antifungal medications do require blood work to make  sure it is a safe drug for you.  A liver function panel will be performed prior to starting the medication and after the first month of treatment.  It is important to have the blood work performed to avoid any harmful side effects.  In general, this medication safe but blood work is required. 4. Laser Therapy.  This treatment is performed by applying a specialized laser to the affected nail plate.  This therapy is noninvasive, fast, and non-painful.  It is not covered by insurance and is therefore, out of pocket.    The results have been very good with a 80-95% cure rate.  [facility] is the only practice in the area to offer this therapy. 5. Permanent Nail Avulsion.  Removing the entire nail so that a new nail will not grow back. 

## 2018-08-04 NOTE — Progress Notes (Signed)
Subjective: Andrew Mcbride presents today with painful, thick toenails 1-5 b/l that he cannot cut and which interfere with daily activities.  Pain is aggravated when wearing enclosed shoe gear. Patient also has painful porokeratotic lesions b/l which are relieved with periodic professional debridement.  London Pepper, MD is his PCP.    Current Outpatient Medications:  .  amoxicillin-clavulanate (AUGMENTIN) 875-125 MG tablet, , Disp: , Rfl:  .  benzonatate (TESSALON) 100 MG capsule, Take 1-2 capsules (100-200 mg total) by mouth 3 (three) times daily as needed for cough., Disp: 40 capsule, Rfl: 0 .  Castellani Paint Modified 1.5 % LIQD, Apply between toes once daily., Disp: 30 mL, Rfl: 1 .  cetirizine (ZYRTEC) 10 MG tablet, Take 1 tablet (10 mg total) by mouth daily., Disp: 30 tablet, Rfl: 11 .  HYDROcodone-homatropine (HYCODAN) 5-1.5 MG/5ML syrup, Take 5 mLs by mouth at bedtime as needed., Disp: 120 mL, Rfl: 0 .  LORazepam (ATIVAN) 1 MG tablet, Take 1 tablet (1 mg total) by mouth every 8 (eight) hours as needed for anxiety., Disp: 7 tablet, Rfl: 0 .  meloxicam (MOBIC) 15 MG tablet, TAKE 1 2 TO 1 (ONE HALF TO ONE) TABLET BY MOUTH ONCE DAILY AS NEEDED, Disp: , Rfl: 0 .  omeprazole (PRILOSEC) 40 MG capsule, Take 1 capsule (40 mg total) by mouth daily., Disp: 30 capsule, Rfl: 0 .  ondansetron (ZOFRAN) 8 MG tablet, TAKE 1 TABLET BY MOUTH EVERY 8 HOURS AS NEEDED FOR NAUSEA AND VOMITING, Disp: , Rfl:  .  pantoprazole (PROTONIX) 40 MG tablet, TAKE 1 TABLET BY MOUTH ONCE DAILY FOR 90 DAYS, Disp: , Rfl: 1 .  pseudoephedrine (SUDAFED 12 HOUR) 120 MG 12 hr tablet, Take 1 tablet (120 mg total) by mouth 2 (two) times daily., Disp: 30 tablet, Rfl: 3  No Known Allergies  Objective:  Vascular Examination: Capillary refill time less than 3 seconds x 10 digits.  Dorsalis pedis and Posterior tibial pulses palpable b/l.  Digital hair present x 10 digits.  Skin temperature gradient WNL b/l.  Dermatological  Examination: Skin with normal turgor, texture and tone b/l.  Interdigital maceration noted webspaces 1-4 bilaterally. No blistering, no weeping, no open wounds.  Toenails 1-5 b/l discolored, thick, dystrophic with subungual debris and pain with palpation to nailbeds due to thickness of nails.  Porokeratotic lesions submet heads 5 b/l with tenderness to palpation. No erythema, no edema, no drainage, no flocculence.  Musculoskeletal: Muscle strength 5/5 to all LE muscle groups.  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  No pain, crepitus or joint limitation noted with ROM.   Neurological: Sensation intact 5/5 b/l with 10 gram monofilament.  Vibratory sensation intact b/l.  Assessment: Painful onychomycosis toenails 1-5 b/l  Porokeratoses submet head 5 b/l Interdigital tinea pedis b/l web spaces 1-4  Plan: 1. Toenails 1-5 b/l were debrided in length and girth without iatrogenic bleeding. Porokeratosis submet head 5 b/l pared and enucleated with sterile scalpel blade without incident. A prescription for modified Castellani's Paint was sent to the patient's pharmacy. Patient is to apply to webspace once daily. Patient to continue soft, supportive shoe gear daily. Patient to report any pedal injuries to medical professional immediately. Follow up 3 months.  Patient/POA to call should there be a concern in the interim.

## 2018-08-07 ENCOUNTER — Other Ambulatory Visit: Payer: Self-pay

## 2018-08-07 MED ORDER — NYSTATIN 100000 UNIT/GM EX POWD: CUTANEOUS | 0 refills | Status: DC

## 2018-11-02 ENCOUNTER — Ambulatory Visit: Payer: Managed Care, Other (non HMO) | Admitting: Podiatry

## 2018-12-23 ENCOUNTER — Other Ambulatory Visit: Payer: Self-pay

## 2018-12-23 ENCOUNTER — Emergency Department (HOSPITAL_BASED_OUTPATIENT_CLINIC_OR_DEPARTMENT_OTHER)
Admission: EM | Admit: 2018-12-23 | Discharge: 2018-12-23 | Disposition: A | Discharge status: Home / Self Care | Payer: Managed Care, Other (non HMO) | Attending: Emergency Medicine | Admitting: Emergency Medicine

## 2018-12-23 ENCOUNTER — Encounter (HOSPITAL_BASED_OUTPATIENT_CLINIC_OR_DEPARTMENT_OTHER): Payer: Self-pay | Admitting: *Deleted

## 2018-12-23 DIAGNOSIS — Y929 Unspecified place or not applicable: Secondary | ICD-10-CM | POA: Insufficient documentation

## 2018-12-23 DIAGNOSIS — S76111A Strain of right quadriceps muscle, fascia and tendon, initial encounter: Secondary | ICD-10-CM | POA: Diagnosis not present

## 2018-12-23 DIAGNOSIS — Y999 Unspecified external cause status: Secondary | ICD-10-CM | POA: Insufficient documentation

## 2018-12-23 DIAGNOSIS — W108XXA Fall (on) (from) other stairs and steps, initial encounter: Secondary | ICD-10-CM | POA: Insufficient documentation

## 2018-12-23 DIAGNOSIS — Z87891 Personal history of nicotine dependence: Secondary | ICD-10-CM | POA: Diagnosis not present

## 2018-12-23 DIAGNOSIS — Y9389 Activity, other specified: Secondary | ICD-10-CM | POA: Diagnosis not present

## 2018-12-23 DIAGNOSIS — S8991XA Unspecified injury of right lower leg, initial encounter: Secondary | ICD-10-CM | POA: Diagnosis present

## 2018-12-23 MED ORDER — NAPROXEN 250 MG PO TABS
500.0000 mg | ORAL_TABLET | Freq: Once | ORAL | Status: AC
  Administered 2018-12-23: 500 mg via ORAL
  Filled 2018-12-23: qty 2

## 2018-12-23 MED ORDER — OXYCODONE-ACETAMINOPHEN 5-325 MG PO TABS
2.0000 | ORAL_TABLET | Freq: Once | ORAL | Status: DC
  Filled 2018-12-23: qty 2

## 2018-12-23 MED ORDER — NAPROXEN 500 MG PO TABS: 500.0000 mg | ORAL_TABLET | Freq: Two times a day (BID) | ORAL | 0 refills | Status: DC | PRN

## 2018-12-23 MED ORDER — NAPROXEN 250 MG PO TABS: 500.0000 mg | ORAL_TABLET | Freq: Once | ORAL | Status: DC

## 2018-12-23 NOTE — ED Provider Notes (Signed)
[facility] Provider Note: Lowella Dell, MD, FACEP  CSN: [id number] MRN: [id number] ARRIVAL: 12/23/18 at 0013 ROOM: [facility]   CHIEF COMPLAINT  Knee Injury   HISTORY OF PRESENT ILLNESS  12/23/18 12:58 AM Andrew Mcbride is a 48 y.o. male twisted his right knee while walking down some steps 2 days ago.  He felt immediate pain just proximal to the patella.  He is now having 9 out of 10 pain at that site.  Pain is worse with movement and he is not able to bear weight on that leg.  Associated swelling.  He has not been taking anything for his pain.   Past Medical History:  Diagnosis Date  . Anxiety   . Chest pain   . Chronic back pain   . Chronic leg pain    left  . Palpitation     History reviewed. No pertinent surgical history.  Family History  Problem Relation Age of Onset  . Hyperlipidemia Mother   . Heart attack Mother   . Lung cancer Father     Social History   Tobacco Use  . Smoking status: Former Games developer  . Smokeless tobacco: Never Used  Substance Use Topics  . Alcohol use: Yes    Comment: occ  . Drug use: No    Prior to Admission medications   Medication Sig Start Date End Date Taking? Authorizing Provider  naproxen (NAPROSYN) 500 MG tablet Take 1 tablet (500 mg total) by mouth 2 (two) times daily as needed (for pain). 12/23/18   Karem Farha, MD  cetirizine (ZYRTEC) 10 MG tablet Take 1 tablet (10 mg total) by mouth daily. 03/14/15 12/23/18  Wallis Bamberg, PA-C  omeprazole (PRILOSEC) 40 MG capsule Take 1 capsule (40 mg total) by mouth daily. 09/07/14 12/23/18  [name], MD  pantoprazole (PROTONIX) 40 MG tablet TAKE 1 TABLET BY MOUTH ONCE DAILY FOR 90 DAYS 12/22/17 12/23/18  [provider]    Allergies Patient has no known allergies.   REVIEW OF SYSTEMS  Negative except as noted here or in the History of Present Illness.   PHYSICAL EXAMINATION  Initial Vital Signs Blood pressure 114/71, pulse (!) 57, temperature 99.1 F (37.3  C), resp. rate 18, height 6' (1.829 m), weight 86.2 kg, SpO2 100 %.  Examination General: Well-developed, well-nourished male in no acute distress; appearance consistent with age of record HENT: normocephalic; atraumatic Eyes: Normal appearance Neck: supple Heart: regular rate and rhythm Lungs: clear to auscultation bilaterally Abdomen: soft; nondistended; nontender; bowel sounds present Extremities: No deformity; tenderness and partial defect of distal right quadriceps with inability to extend right leg at the knee possibly due to pain; pulses normal Neurologic: Awake, alert and oriented; motor function intact in all extremities and symmetric; no facial droop Skin: Warm and dry Psychiatric: Normal mood and affect   RESULTS  Summary of this visit's results, reviewed and interpreted by myself:   EKG Interpretation  Date/Time:    Ventricular Rate:    PR Interval:    QRS Duration:   QT Interval:    QTC Calculation:   R Axis:     Text Interpretation:        Laboratory Studies: No results found for this or any previous visit (from the past 24 hour(s)). Imaging Studies: No results found.  ED COURSE and MDM  Nursing notes, initial and subsequent vitals signs, including pulse oximetry, reviewed and interpreted by myself.  Vitals:   12/23/18 0020 12/23/18 0021  BP:  114/71  Pulse:  (!) 57  Resp:  18  Temp:  99.1 F (37.3 C)  SpO2:  100%  Weight: 86.2 kg   Height: 6' (1.829 m)    Medications  oxyCODONE-acetaminophen (PERCOCET/ROXICET) 5-325 MG per tablet 2 tablet (has no administration in time range)  naproxen (NAPROSYN) tablet 500 mg (has no administration in time range)  naproxen (NAPROSYN) tablet 500 mg (500 mg Oral Given 12/23/18 0111)    Examination consistent with a partial tear of the right quadriceps tendon.  We will place a knee immobilizer and crutches and refer to orthopedics.  PROCEDURES  Procedures   ED DIAGNOSES     ICD-10-CM   1. Quadriceps  muscle strain, right, initial encounter  S76.111A   2. Accidental fall on or from stairs or steps, initial encounter  W10.8XXA        Jessica Seidman, Jenny Reichmann, MD 12/23/18 [phone]

## 2018-12-23 NOTE — ED Triage Notes (Signed)
Pt c/o right knee injury x 2 days ago

## 2018-12-28 ENCOUNTER — Encounter: Payer: Self-pay | Admitting: Orthopedic Surgery

## 2018-12-28 ENCOUNTER — Other Ambulatory Visit: Payer: Self-pay

## 2018-12-28 ENCOUNTER — Ambulatory Visit (INDEPENDENT_AMBULATORY_CARE_PROVIDER_SITE_OTHER): Payer: Managed Care, Other (non HMO)

## 2018-12-28 ENCOUNTER — Ambulatory Visit (INDEPENDENT_AMBULATORY_CARE_PROVIDER_SITE_OTHER): Payer: Managed Care, Other (non HMO) | Admitting: Orthopedic Surgery

## 2018-12-28 VITALS — Ht 72.0 in | Wt 190.0 lb

## 2018-12-28 DIAGNOSIS — M25461 Effusion, right knee: Secondary | ICD-10-CM

## 2018-12-28 DIAGNOSIS — Q741 Congenital malformation of knee: Secondary | ICD-10-CM | POA: Diagnosis not present

## 2018-12-28 DIAGNOSIS — M25561 Pain in right knee: Secondary | ICD-10-CM

## 2018-12-30 ENCOUNTER — Encounter: Payer: Self-pay | Admitting: Orthopedic Surgery

## 2018-12-30 NOTE — Progress Notes (Signed)
Office Visit Note   Patient: Andrew Mcbride           Date of Birth: 1970-04-01           MRN: [id number] Visit Date: 12/28/2018 Requested by: London Pepper, MD [facility] [address],  [place] [address] PCP: London Pepper, MD  Subjective: Chief Complaint  Patient presents with  . Right Knee - Pain    DOI 12/21/2018    HPI: Andrew Mcbride is a 48 y.o. male who presents to the office complaining of right knee pain.  Patient notes that on 12/21/2018 he injured his right knee while descending stairs.  He was seen in the ED on 12/23/2018.  He notes pain diffusely throughout the knee but localizes the worst pain to the medial joint line and lateral and proximal aspect of the right patella.  His pain is worst when trying to flex the knee.  He notes swelling but states that his swelling and pain did not come on until about 1 day or a day and a half after initially injuring his right knee.  He has been using ice/heat/naproxen with little relief.  He notes that it feels weak and gives way at times.  He had no pain prior to the injury.  He has never had surgery on his right knee.  He denies any groin pain or low back pain or any radicular symptoms.. He has a history of playing basketball in college.  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              ROS:  All systems reviewed are negative as they relate to the chief complaint within the history of present illness.  Patient denies fevers or chills.  Assessment & Plan: Visit Diagnoses:  1. Acute pain of right knee   2. Bipartite patella     Plan: Patient is a 48 year old male who presents with right knee pain that came on about a day after an injury descending stairs.  He describes a pop at the time of injury.  He cannot recall the exact mechanism of how he injured his right knee while going down the stairs.  X-rays are negative for any acute fracture that can be identified with radiographs.  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  His extensor mechanism is  intact but he does have reduced flexion and more pain with flexion.  Plan for MRI of the right knee to evaluate for medial meniscal tear versus symptomatic bipartite patella .  Provided work note to keep patient out of work over the next 3 weeks.  Aspirated and injected the right knee as well to provide some pain relief.  Aspirate was T colored and not overtly bloody.  Patient agreed with plan and will follow-up after MRI to review results.  Follow-Up Instructions: No follow-ups on file.   Orders:  Orders Placed This Encounter  Procedures  . XR KNEE 3 VIEW RIGHT  . MR Knee Right w/o contrast   No orders of the defined types were placed in this encounter.     Procedures: Large Joint Inj: L knee on 12/31/2018 8:36 AM Indications: diagnostic evaluation, joint swelling and pain Details: 18 G 1.5 in needle, superolateral approach  Arthrogram: No  Medications: 5 mL lidocaine 1 %; 40 mg methylPREDNISolone acetate 40 MG/ML; 4 mL bupivacaine 0.25 % Outcome: tolerated well, no immediate complications Procedure, treatment alternatives, risks and benefits explained, specific  risks discussed. Consent was given by the patient. Immediately prior to procedure a time out was called to verify the correct patient, procedure, equipment, support staff and site/side marked as required. Patient was prepped and draped in the usual sterile fashion.       Clinical Data: No additional findings.  Objective: Vital Signs: Ht 6' (1.829 m)   Wt 190 lb (86.2 kg)   BMI 25.77 kg/m   Physical Exam:  Constitutional: Patient appears well-developed HEENT:  Head: Normocephalic Eyes:EOM are normal Neck: Normal range of motion Cardiovascular: Normal rate Pulmonary/chest: Effort normal Neurologic: Patient is alert Skin: Skin is warm Psychiatric: Patient has normal mood and affect  Ortho Exam:  Right knee Exam Positive effusion  mild. Extensor mechanism intact Tender to palpation over the medial joint line and the lateral/proximal aspect of the right patella.  Also tender over the quadriceps tendon.  No tenderness palpation over the pes anserine bursa, lateral joint line, proximal tibia, MCL/LCL insertions, patellar tendon Stable to varus/valgus stresses.  Stable to anterior/posterior drawer Extension to 0 degrees Flexion to about 70 degrees  Specialty Comments:  No specialty comments available.  Imaging: No results found.   PMFS History: Patient Active Problem List   Diagnosis Date Noted  . Tobacco abuse 05/03/2014  . Palpitations 05/03/2014  . Chest pain 05/02/2014  . Anxiety 05/02/2014   Past Medical History:  Diagnosis Date  . Anxiety   . Chest pain   . Chronic back pain   . Chronic leg pain    left  . Palpitation     Family History  Problem Relation Age of Onset  . Hyperlipidemia Mother   . Heart attack Mother   . Lung cancer Father     No past surgical history on file. Social History   Occupational History  . Not on file  Tobacco Use  . Smoking status: Former Games developer  . Smokeless tobacco: Never Used  Substance and Sexual Activity  . Alcohol use: Yes    Comment: occ  . Drug use: No  . Sexual activity: Not on file

## 2018-12-31 DIAGNOSIS — M25461 Effusion, right knee: Secondary | ICD-10-CM

## 2018-12-31 MED ORDER — LIDOCAINE HCL 1 % IJ SOLN
5.0000 mL | INTRAMUSCULAR | Status: AC | PRN
  Administered 2018-12-31: 09:00:00 5 mL

## 2018-12-31 MED ORDER — BUPIVACAINE HCL 0.25 % IJ SOLN
4.0000 mL | INTRAMUSCULAR | Status: AC | PRN
  Administered 2018-12-31: 09:00:00 4 mL via INTRA_ARTICULAR

## 2018-12-31 MED ORDER — METHYLPREDNISOLONE ACETATE 40 MG/ML IJ SUSP
40.0000 mg | INTRAMUSCULAR | Status: AC | PRN
  Administered 2018-12-31: 09:00:00 40 mg via INTRA_ARTICULAR

## 2019-01-14 ENCOUNTER — Telehealth: Payer: Self-pay | Admitting: Orthopedic Surgery

## 2019-01-14 NOTE — Telephone Encounter (Signed)
[facility] called. The patient cancelled his MRI with them. He said it's because he feels better and no longer needs it.   Call back number to [place]: [phone]  Patient call back number: [phone]

## 2019-01-14 NOTE — Telephone Encounter (Signed)
FYI

## 2019-01-18 ENCOUNTER — Other Ambulatory Visit: Payer: Managed Care, Other (non HMO)

## 2019-01-22 ENCOUNTER — Ambulatory Visit: Payer: Managed Care, Other (non HMO) | Admitting: Orthopedic Surgery

## 2019-03-15 ENCOUNTER — Other Ambulatory Visit: Payer: Self-pay

## 2019-03-15 ENCOUNTER — Encounter: Payer: Self-pay | Admitting: Podiatry

## 2019-03-15 ENCOUNTER — Ambulatory Visit: Payer: Managed Care, Other (non HMO) | Admitting: Podiatry

## 2019-03-15 VITALS — Temp 97.2°F

## 2019-03-15 DIAGNOSIS — M79674 Pain in right toe(s): Secondary | ICD-10-CM | POA: Diagnosis not present

## 2019-03-15 DIAGNOSIS — Q828 Other specified congenital malformations of skin: Secondary | ICD-10-CM

## 2019-03-15 DIAGNOSIS — M79671 Pain in right foot: Secondary | ICD-10-CM | POA: Diagnosis not present

## 2019-03-15 DIAGNOSIS — M79675 Pain in left toe(s): Secondary | ICD-10-CM | POA: Diagnosis not present

## 2019-03-15 DIAGNOSIS — M79672 Pain in left foot: Secondary | ICD-10-CM

## 2019-03-15 DIAGNOSIS — B351 Tinea unguium: Secondary | ICD-10-CM | POA: Diagnosis not present

## 2019-03-15 NOTE — Patient Instructions (Signed)

## 2019-03-18 NOTE — Progress Notes (Signed)
Subjective: Andrew Mcbride presents today for follow up of painful porokeratotic lesion(s) plantar aspect of both feet and painful mycotic toenails b/l that limit ambulation. Aggravating factors include weightbearing with and without shoe gear. Pain for both is relieved with periodic professional debridement..   No Known Allergies   Objective: Vitals:   03/15/19 0907  Temp: (!) 97.2 F (36.2 C)    Vascular Examination:  Capillary fill time to digits <3s b/l. Palpable DP pulses b/l. Palpable PT pulses b/l. Pedal hair sparse b/l. Skin temperature gradient within normal limits b/l.  Dermatological Examination: Pedal skin with normal turgor, texture and tone bilaterally. No open wounds bilaterally. No interdigital macerations bilaterally. Toenails 1-5 b/l elongated, dystrophic, thickened, crumbly with subungual debris and tenderness to dorsal palpation. Porokeratotic lesion(s) submet head 5 b/l. No erythema, no edema, no drainage, no flocculence.  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  Neurological: Protective sensation intact 5/5 intact bilaterally with 10g monofilament b/l and vibratory sensation intact b/l  Assessment: 1. Pain due to onychomycosis of toenails of both feet   2. Porokeratosis   3. Pain in both feet    Plan: -Toenails 1-5 b/l were debrided in length and girth with sterile nail nippers and dremel without iatrogenic bleeding.  -Painful porokeratotic lesions submet head 5 b/l pared and enucleated with sterile scalpel blade without incident. -Patient to continue soft, supportive shoe gear daily. -Patient to report any pedal injuries to medical professional immediately. -Patient/POA to call should there be question/concern in the interim.  Return in about 3 months (around 06/15/2019) for nail and callus trim.

## 2019-06-18 ENCOUNTER — Encounter: Payer: Self-pay | Admitting: Podiatry

## 2019-06-18 ENCOUNTER — Ambulatory Visit (INDEPENDENT_AMBULATORY_CARE_PROVIDER_SITE_OTHER): Payer: Managed Care, Other (non HMO) | Admitting: Podiatry

## 2019-06-18 ENCOUNTER — Other Ambulatory Visit: Payer: Self-pay

## 2019-06-18 DIAGNOSIS — M79675 Pain in left toe(s): Secondary | ICD-10-CM | POA: Diagnosis not present

## 2019-06-18 DIAGNOSIS — B351 Tinea unguium: Secondary | ICD-10-CM | POA: Diagnosis not present

## 2019-06-18 DIAGNOSIS — M79674 Pain in right toe(s): Secondary | ICD-10-CM | POA: Diagnosis not present

## 2019-06-18 DIAGNOSIS — Q828 Other specified congenital malformations of skin: Secondary | ICD-10-CM | POA: Diagnosis not present

## 2019-06-18 DIAGNOSIS — M79671 Pain in right foot: Secondary | ICD-10-CM

## 2019-06-18 DIAGNOSIS — M79672 Pain in left foot: Secondary | ICD-10-CM

## 2019-06-18 NOTE — Patient Instructions (Signed)

## 2019-06-23 NOTE — Progress Notes (Signed)
Subjective: Andrew Mcbride is a pleasant 49 y.o. male patient seen today painful porokeratotic lesion(s) b/l feet and painful mycotic toenails b/l that limit ambulation. Aggravating factors include weightbearing with and without shoe gear. Pain for both is relieved with periodic professional debridement.  Patient Active Problem List   Diagnosis Date Noted  . Tobacco abuse 05/03/2014  . Palpitations 05/03/2014  . Chest pain 05/02/2014  . Anxiety 05/02/2014    Current Outpatient Medications on File Prior to Visit  Medication Sig Dispense Refill  . naproxen (NAPROSYN) 500 MG tablet Take 1 tablet (500 mg total) by mouth 2 (two) times daily as needed (for pain). 20 tablet 0  . [DISCONTINUED] cetirizine (ZYRTEC) 10 MG tablet Take 1 tablet (10 mg total) by mouth daily. 30 tablet 11  . [DISCONTINUED] omeprazole (PRILOSEC) 40 MG capsule Take 1 capsule (40 mg total) by mouth daily. 30 capsule 0  . [DISCONTINUED] pantoprazole (PROTONIX) 40 MG tablet TAKE 1 TABLET BY MOUTH ONCE DAILY FOR 90 DAYS  1   No current facility-administered medications on file prior to visit.    No Known Allergies  Objective: Physical Exam  General: Andrew Mcbride is a pleasant 49 y.o. African American male, in NAD. AAO x 3.   Vascular:  Capillary fill time to digits <3 seconds b/l lower extremities. Palpable DP pulses b/l. Palpable PT pulses b/l. Pedal hair sparse b/l. Skin temperature gradient within normal limits b/l. No pain with calf compression b/l. No edema noted b/l.  Dermatological:  Pedal skin with normal turgor, texture and tone bilaterally. No open wounds bilaterally. No interdigital macerations bilaterally. Toenails 1-5 b/l elongated, discolored, dystrophic, thickened, crumbly with subungual debris and tenderness to dorsal palpation. Porokeratotic lesion(s) submet head 5 left foot and submet head 5 right foot. No erythema, no edema, no drainage, no flocculence.  Musculoskeletal:  Normal muscle strength 5/5 to  all lower extremity muscle groups bilaterally. No pain crepitus or joint limitation noted with ROM b/l. No gross bony deformities bilaterally. Patient ambulates independent of any assistive aids.  Neurological:  Protective sensation intact 5/5 intact bilaterally with 10g monofilament b/l. Vibratory sensation intact b/l. Proprioception intact bilaterally. Babinski reflex negative b/l. Clonus negative b/l.  Assessment and Plan:  1. Pain due to onychomycosis of toenails of both feet   2. Porokeratosis   3. Pain in both feet    -Examined patient. -Toenails 1-5 b/l were debrided in length and girth with sterile nail nippers and dremel without iatrogenic bleeding.  -Painful porokeratotic lesion(s) submet head 5 left foot and submet head 5 right foot pared and enucleated with sterile scalpel blade without incident. Discussed use of reusable horse shoe pads for painful porokeratoses. We did not have any in the office and they will be ordered for him. Office will call him when they arrive.  -Patient to continue soft, supportive shoe gear daily. -Patient to report any pedal injuries to medical professional immediately. -Patient/POA to call should there be question/concern in the interim.  Return in 3 months (on 09/18/2019) for nail and callus trim.  Freddie Breech, DPM

## 2019-09-10 ENCOUNTER — Ambulatory Visit: Payer: Managed Care, Other (non HMO) | Admitting: Podiatry

## 2019-09-10 ENCOUNTER — Encounter: Payer: Self-pay | Admitting: Podiatry

## 2019-09-10 ENCOUNTER — Other Ambulatory Visit: Payer: Self-pay

## 2019-09-10 DIAGNOSIS — B351 Tinea unguium: Secondary | ICD-10-CM

## 2019-09-10 DIAGNOSIS — M79674 Pain in right toe(s): Secondary | ICD-10-CM | POA: Diagnosis not present

## 2019-09-10 DIAGNOSIS — M79671 Pain in right foot: Secondary | ICD-10-CM

## 2019-09-10 DIAGNOSIS — M79675 Pain in left toe(s): Secondary | ICD-10-CM

## 2019-09-10 DIAGNOSIS — Q828 Other specified congenital malformations of skin: Secondary | ICD-10-CM

## 2019-09-10 DIAGNOSIS — M79672 Pain in left foot: Secondary | ICD-10-CM

## 2019-09-12 NOTE — Progress Notes (Signed)
Subjective: Andrew Mcbride is a pleasant 49 y.o. male patient seen today painful porokeratotic lesion(s) b/l feet and painful mycotic toenails b/l that limit ambulation. Aggravating factors include weightbearing with and without shoe gear. Pain for both is relieved with periodic professional debridement.   He states he will think about having surgery on his feet after his son graduates from college.  Patient Active Problem List   Diagnosis Date Noted  . Tobacco abuse 05/03/2014  . Palpitations 05/03/2014  . Chest pain 05/02/2014  . Anxiety 05/02/2014    Current Outpatient Medications on File Prior to Visit  Medication Sig Dispense Refill  . naproxen (NAPROSYN) 500 MG tablet Take 1 tablet (500 mg total) by mouth 2 (two) times daily as needed (for pain). 20 tablet 0  . [DISCONTINUED] cetirizine (ZYRTEC) 10 MG tablet Take 1 tablet (10 mg total) by mouth daily. 30 tablet 11  . [DISCONTINUED] omeprazole (PRILOSEC) 40 MG capsule Take 1 capsule (40 mg total) by mouth daily. 30 capsule 0  . [DISCONTINUED] pantoprazole (PROTONIX) 40 MG tablet TAKE 1 TABLET BY MOUTH ONCE DAILY FOR 90 DAYS  1   No current facility-administered medications on file prior to visit.    No Known Allergies  Objective: Physical Exam  General: Andrew Mcbride is a pleasant 49 y.o. African American male, in NAD. AAO x 3.   Vascular:  Capillary fill time to digits <3 seconds b/l lower extremities. Palpable DP pulses b/l. Palpable PT pulses b/l. Pedal hair sparse b/l. Skin temperature gradient within normal limits b/l. No pain with calf compression b/l. No edema noted b/l.  Dermatological:  Pedal skin with normal turgor, texture and tone bilaterally. No open wounds bilaterally. No interdigital macerations bilaterally. Toenails 1-5 b/l elongated, discolored, dystrophic, thickened, crumbly with subungual debris and tenderness to dorsal palpation. Porokeratotic lesion(s) submet head 5 left foot and submet head 5 right foot. No  erythema, no edema, no drainage, no flocculence.  Musculoskeletal:  Normal muscle strength 5/5 to all lower extremity muscle groups bilaterally. No pain crepitus or joint limitation noted with ROM b/l. No gross bony deformities bilaterally. Patient ambulates independent of any assistive aids.  Neurological:  Protective sensation intact 5/5 intact bilaterally with 10g monofilament b/l. Vibratory sensation intact b/l. Proprioception intact bilaterally. Babinski reflex negative b/l. Clonus negative b/l.  Assessment and Plan:  1. Pain due to onychomycosis of toenails of both feet   2. Porokeratosis   3. Pain in both feet    -Examined patient. -Toenails 1-5 b/l were debrided in length and girth with sterile nail nippers and dremel without iatrogenic bleeding.  -Painful porokeratotic lesion(s) submet head 5 left foot and submet head 5 right foot pared and enucleated with sterile scalpel blade without incident.  -Patient to continue soft, supportive shoe gear daily. -Patient to report any pedal injuries to medical professional immediately. -Patient/POA to call should there be question/concern in the interim.  Return in about 9 weeks (around 11/12/2019) for nail and callus trim.  Freddie Breech, DPM

## 2019-12-27 ENCOUNTER — Ambulatory Visit: Payer: Managed Care, Other (non HMO) | Admitting: Podiatry

## 2019-12-28 ENCOUNTER — Encounter: Payer: Self-pay | Admitting: Podiatry

## 2019-12-28 ENCOUNTER — Other Ambulatory Visit: Payer: Self-pay

## 2019-12-28 ENCOUNTER — Ambulatory Visit: Payer: Managed Care, Other (non HMO) | Admitting: Podiatry

## 2019-12-28 DIAGNOSIS — Q828 Other specified congenital malformations of skin: Secondary | ICD-10-CM | POA: Diagnosis not present

## 2019-12-28 DIAGNOSIS — M79675 Pain in left toe(s): Secondary | ICD-10-CM | POA: Diagnosis not present

## 2019-12-28 DIAGNOSIS — M79674 Pain in right toe(s): Secondary | ICD-10-CM | POA: Diagnosis not present

## 2019-12-28 DIAGNOSIS — B351 Tinea unguium: Secondary | ICD-10-CM | POA: Diagnosis not present

## 2019-12-28 DIAGNOSIS — M79671 Pain in right foot: Secondary | ICD-10-CM

## 2019-12-28 DIAGNOSIS — M79672 Pain in left foot: Secondary | ICD-10-CM

## 2020-01-02 NOTE — Progress Notes (Signed)
Subjective: Andrew Mcbride is a pleasant 49 y.o. male patient seen today painful porokeratotic lesion(s) b/l feet and painful mycotic toenails b/l that limit ambulation. Aggravating factors include weightbearing with and without shoe gear. Pain for both is relieved with periodic professional debridement.   He voices no new pedal complaints on today's visit.  Patient Active Problem List   Diagnosis Date Noted  . Tobacco abuse 05/03/2014  . Palpitations 05/03/2014  . Chest pain 05/02/2014  . Anxiety 05/02/2014    Current Outpatient Medications on File Prior to Visit  Medication Sig Dispense Refill  . naproxen (NAPROSYN) 500 MG tablet Take 1 tablet (500 mg total) by mouth 2 (two) times daily as needed (for pain). 20 tablet 0  . [DISCONTINUED] cetirizine (ZYRTEC) 10 MG tablet Take 1 tablet (10 mg total) by mouth daily. 30 tablet 11  . [DISCONTINUED] omeprazole (PRILOSEC) 40 MG capsule Take 1 capsule (40 mg total) by mouth daily. 30 capsule 0  . [DISCONTINUED] pantoprazole (PROTONIX) 40 MG tablet TAKE 1 TABLET BY MOUTH ONCE DAILY FOR 90 DAYS  1   No current facility-administered medications on file prior to visit.    No Known Allergies  Objective: Physical Exam  General: Andrew Mcbride is a pleasant 49 y.o. African American male, in NAD. AAO x 3.   Vascular:  Capillary fill time to digits <3 seconds b/l lower extremities. Palpable DP pulses b/l. Palpable PT pulses b/l. Pedal hair sparse b/l. Skin temperature gradient within normal limits b/l. No pain with calf compression b/l. No edema noted b/l.  Dermatological:  Pedal skin with normal turgor, texture and tone bilaterally. No open wounds bilaterally. No interdigital macerations bilaterally. Toenails 1-5 b/l elongated, discolored, dystrophic, thickened, crumbly with subungual debris and tenderness to dorsal palpation. Porokeratotic lesion(s) submet head 5 left foot and submet head 5 right foot. No erythema, no edema, no drainage, no  flocculence.  Musculoskeletal:  Normal muscle strength 5/5 to all lower extremity muscle groups bilaterally. No pain crepitus or joint limitation noted with ROM b/l. No gross bony deformities bilaterally. Patient ambulates independent of any assistive aids.  Neurological:  Protective sensation intact 5/5 intact bilaterally with 10g monofilament b/l. Vibratory sensation intact b/l. Proprioception intact bilaterally. Babinski reflex negative b/l. Clonus negative b/l.  Assessment and Plan:  1. Pain due to onychomycosis of toenails of both feet   2. Porokeratosis   3. Pain in both feet    -Examined patient. -No new findings. No new orders. -Toenails 1-5 b/l were debrided in length and girth with sterile nail nippers and dremel without iatrogenic bleeding.  -Painful porokeratotic lesion(s) submet head 5 left foot and submet head 5 right foot pared and enucleated with sterile scalpel blade without incident.  -Patient to continue soft, supportive shoe gear daily. -Patient to report any pedal injuries to medical professional immediately. -Patient/POA to call should there be question/concern in the interim.  Return in about 3 months (around 03/27/2020) for nails w/corn(s)/callus(es)/porokeratotic lesion(s).  Freddie Breech, DPM

## 2020-04-04 ENCOUNTER — Ambulatory Visit: Payer: Managed Care, Other (non HMO) | Admitting: Podiatry

## 2021-01-17 ENCOUNTER — Other Ambulatory Visit: Payer: Self-pay

## 2021-01-17 ENCOUNTER — Encounter (HOSPITAL_BASED_OUTPATIENT_CLINIC_OR_DEPARTMENT_OTHER): Payer: Self-pay

## 2021-01-17 ENCOUNTER — Emergency Department (HOSPITAL_BASED_OUTPATIENT_CLINIC_OR_DEPARTMENT_OTHER)
Admission: EM | Admit: 2021-01-17 | Discharge: 2021-01-17 | Disposition: A | Discharge status: Home / Self Care | Payer: 59 | Attending: Emergency Medicine | Admitting: Emergency Medicine

## 2021-01-17 ENCOUNTER — Emergency Department (HOSPITAL_BASED_OUTPATIENT_CLINIC_OR_DEPARTMENT_OTHER): Payer: 59 | Admitting: Radiology

## 2021-01-17 DIAGNOSIS — R0789 Other chest pain: Secondary | ICD-10-CM | POA: Diagnosis not present

## 2021-01-17 LAB — BASIC METABOLIC PANEL
Anion gap: 8 (ref 5–15)
BUN: 20 mg/dL (ref 6–20)
CO2: 30 mmol/L (ref 22–32)
Calcium: 9.2 mg/dL (ref 8.9–10.3)
Chloride: 102 mmol/L (ref 98–111)
Creatinine, Ser: 1.28 mg/dL — ABNORMAL HIGH (ref 0.61–1.24)
GFR, Estimated: >60 mL/min (ref >60)
Glucose, Bld: 97 mg/dL (ref 70–99)
Potassium: 4 mmol/L (ref 3.5–5.1)
Sodium: 140 mmol/L (ref 135–145)

## 2021-01-17 LAB — CBC
HCT: 43 % (ref 39.0–52.0)
Hemoglobin: 14 g/dL (ref 13.0–17.0)
MCH: 29.1 pg (ref 26.0–34.0)
MCHC: 32.6 g/dL (ref 30.0–36.0)
MCV: 89.4 fL (ref 80.0–100.0)
Platelets: 195 10*3/uL (ref 150–400)
RBC: 4.81 MIL/uL (ref 4.22–5.81)
RDW: 12.7 % (ref 11.5–15.5)
WBC: 6.7 10*3/uL (ref 4.0–10.5)
nRBC: 0 % (ref 0.0–0.2)

## 2021-01-17 LAB — TROPONIN I (HIGH SENSITIVITY)
Troponin I (High Sensitivity): <2 ng/L (ref <18)
Troponin I (High Sensitivity): <2 ng/L (ref <18)

## 2021-01-17 MED ORDER — NAPROXEN 500 MG PO TABS: 500.0000 mg | ORAL_TABLET | Freq: Two times a day (BID) | ORAL | 0 refills | Status: DC | PRN

## 2021-01-17 MED ORDER — NAPROXEN 250 MG PO TABS
500.0000 mg | ORAL_TABLET | Freq: Once | ORAL | Status: AC
  Administered 2021-01-17: 500 mg via ORAL
  Filled 2021-01-17: qty 2

## 2021-01-17 NOTE — ED Provider Notes (Signed)
[facility] Provider Note   CSN: [id number] Arrival date & time: 01/17/21 1740  History Chief Complaint  Patient presents with   Chest Pain    Andrew Mcbride is a 51 y.o. male reports dull aching L chest wall pain for the last 2 days since he was smoking a cigar. Pain is worse with movements and bending. No SOB, cough or fever. Does not radiate.    Home Medications Prior to Admission medications   Medication Sig Start Date End Date Taking? Authorizing Provider  naproxen (NAPROSYN) 500 MG tablet Take 1 tablet (500 mg total) by mouth 2 (two) times daily as needed (for pain). 01/17/21   Truddie Hidden, MD  cetirizine (ZYRTEC) 10 MG tablet Take 1 tablet (10 mg total) by mouth daily. 03/14/15 12/23/18  Jaynee Eagles, PA-C  omeprazole (PRILOSEC) 40 MG capsule Take 1 capsule (40 mg total) by mouth daily. 09/07/14 12/23/18  Julianne Rice, MD  pantoprazole (PROTONIX) 40 MG tablet TAKE 1 TABLET BY MOUTH ONCE DAILY FOR 90 DAYS 12/22/17 12/23/18  [provider]     Allergies    Patient has no known allergies.   Review of Systems   Review of Systems Please see HPI for pertinent positives and negatives  Physical Exam BP 124/74    Pulse (!) 51    Temp 97.8 F (36.6 C)    Resp 14    Ht 6' (1.829 m)    Wt 90.7 kg    SpO2 100%    BMI 27.12 kg/m   Physical Exam Vitals and nursing note reviewed.  Constitutional:      Appearance: Normal appearance.  HENT:     Head: Normocephalic and atraumatic.     Nose: Nose normal.     Mouth/Throat:     Mouth: Mucous membranes are moist.  Eyes:     Extraocular Movements: Extraocular movements intact.     Conjunctiva/sclera: Conjunctivae normal.  Cardiovascular:     Rate and Rhythm: Normal rate.  Pulmonary:     Effort: Pulmonary effort is normal.     Breath sounds: Normal breath sounds.  Chest:     Chest wall: Tenderness (L upper chest, reproduces symptoms.) present.  Abdominal:     General: Abdomen is flat.      Palpations: Abdomen is soft.     Tenderness: There is no abdominal tenderness.  Musculoskeletal:        General: No swelling. Normal range of motion.     Cervical back: Neck supple.  Skin:    General: Skin is warm and dry.  Neurological:     General: No focal deficit present.     Mental Status: He is alert.  Psychiatric:        Mood and Affect: Mood normal.    ED Results / Procedures / Treatments   EKG EKG Interpretation  Date/Time:  Wednesday January 17 2021 17:47:38 EST Ventricular Rate:  57 PR Interval:  188 QRS Duration: 84 QT Interval:  380 QTC Calculation: 369 R Axis:   96 Text Interpretation: Sinus bradycardia Rightward axis Borderline ECG When compared with ECG of 06-Sep-2014 23:18, No significant change since last tracing Confirmed by Calvert Cantor [phone]) on 01/17/2021 5:49:35 PM  Procedures Procedures  Medications Ordered in the ED Medications  naproxen (NAPROSYN) tablet 500 mg (has no administration in time range)    Initial Impression and Plan  Patient here with atypical reproducible chest pain. Had EKG without ischemic changes. Labs done in triage are unremarkable  including CBC, BMP and Trop x 2. CXR is clear. Plan Rx for Naprosyn, advised rest. Avoid heavy lifting and PCP follow up. RTED for any other concerns.   ED Course       MDM Rules/Calculators/A&P Medical Decision Making Problems Addressed: Chest wall pain: acute illness or injury with systemic symptoms  Amount and/or Complexity of Data Reviewed Labs: ordered. Decision-making details documented in ED Course. Radiology: ordered and independent interpretation performed. Decision-making details documented in ED Course. ECG/medicine tests: ordered and independent interpretation performed.  Risk Prescription drug management.    Final Clinical Impression(s) / ED Diagnoses Final diagnoses:  Chest wall pain    Rx / DC Orders ED Discharge Orders          Ordered    naproxen  (NAPROSYN) 500 MG tablet  2 times daily PRN        01/17/21 2240             Truddie Hidden, MD 01/17/21 2240

## 2021-01-17 NOTE — ED Triage Notes (Signed)
Pt states he inhaled some cigars 2 days ago and then he started having intermittent CP. Pt states he has also broke out in a sweat. Pain is localized to mid sternum and is described as dull. Pt states certain movements make the pain hurt worse.

## 2021-04-13 ENCOUNTER — Ambulatory Visit (INDEPENDENT_AMBULATORY_CARE_PROVIDER_SITE_OTHER): Payer: 59 | Admitting: Podiatry

## 2021-04-13 ENCOUNTER — Encounter: Payer: Self-pay | Admitting: Podiatry

## 2021-04-13 DIAGNOSIS — M79672 Pain in left foot: Secondary | ICD-10-CM

## 2021-04-13 DIAGNOSIS — B351 Tinea unguium: Secondary | ICD-10-CM | POA: Diagnosis not present

## 2021-04-13 DIAGNOSIS — L600 Ingrowing nail: Secondary | ICD-10-CM

## 2021-04-13 DIAGNOSIS — M79674 Pain in right toe(s): Secondary | ICD-10-CM | POA: Diagnosis not present

## 2021-04-13 DIAGNOSIS — M79675 Pain in left toe(s): Secondary | ICD-10-CM | POA: Diagnosis not present

## 2021-04-13 DIAGNOSIS — M79671 Pain in right foot: Secondary | ICD-10-CM

## 2021-04-13 DIAGNOSIS — Q828 Other specified congenital malformations of skin: Secondary | ICD-10-CM

## 2021-04-13 NOTE — Patient Instructions (Signed)
EPSOM SALT FOOT SOAK INSTRUCTIONS  *IF YOU HAVE BEEN PRESCRIBED ANTIBIOTICS, TAKE AS INSTRUCTED UNTIL ALL ARE GONE*  Shopping List:  A. Plain epsom salt (not scented) B. Neosporin Cream/Ointment or Bacitracin Cream/Ointment (or prescribed antiobiotic drops/cream/ointment) C. 1-inch fabric band-aids   Place 1/4 cup of epsom salts in 2 quarts of warm tap water. IF YOU ARE DIABETIC, OR HAVE NEUROPATHY, CHECK THE TEMPERATURE OF THE WATER WITH YOUR ELBOW.   Submerge your foot/feet in the solution and soak for 10-15 minutes.      3.  Next, remove your foot/feet from solution, blot dry the affected area.    4.  Apply light amount of antibiotic cream/ointment and cover with fabric band-aid .  5.  This soak should be done once a day for 7 days.   6.  Monitor for any signs/symptoms of infection such as redness, swelling, odor, drainage, increased pain, or non-healing of digit.   7.  Please do not hesitate to call the office and speak to a Nurse or Doctor if you have questions.   8.  If you experience fever, chills, nightsweats, nausea or vomiting with worsening of digit/foot, please go to the emergency room.  

## 2021-04-13 NOTE — Progress Notes (Signed)
?  Subjective:  ?Patient ID: Andrew Mcbride, male    DOB: 1970/12/18,  MRN: [id number] ? ?Andrew Mcbride presents to clinic today for painful porokeratotic lesion(s) of both feet and painful mycotic toenails that limit ambulation. Painful toenails interfere with ambulation. Aggravating factors include wearing enclosed shoe gear. Pain is relieved with periodic professional debridement. Painful porokeratotic lesions are aggravated when weightbearing with and without shoegear. Pain is relieved with periodic professional debridement. ? ?New problem(s): Painful left great toe for the past week. Location is lateral border left great toe. He denies any redness, drainage or swelling, but discomfort prevents him from wearing enclosed shoe gear. He has not attempted any treatment. ? ?PCP is London Pepper, MD. ? ?No Known Allergies ? ?Review of Systems: Negative except as noted in the HPI. ? ?Objective: No changes noted in today's physical examination. ?General: Andrew Mcbride is a pleasant 51 y.o. African American male, in NAD. AAO x 3.  ? ?Vascular:  ?Capillary fill time to digits <3 seconds b/l lower extremities. Palpable DP pulses b/l. Palpable PT pulses b/l. Pedal hair sparse b/l. Skin temperature gradient within normal limits b/l. No pain with calf compression b/l. No edema noted b/l. ? ?Dermatological:  ?Pedal skin with normal turgor, texture and tone bilaterally. No open wounds bilaterally. No interdigital macerations bilaterally. Toenails 1-5 b/l elongated, discolored, dystrophic, thickened, crumbly with subungual debris and tenderness to dorsal palpation. Porokeratotic lesion(s) submet head 5 left foot and submet head 5 right foot. No erythema, no edema, no drainage, no flocculence. Incurvated nailplate left great toe lateral border(s) with tenderness to palpation. No erythema, no edema, no drainage noted.  ? ?Musculoskeletal:  ?Normal muscle strength 5/5 to all lower extremity muscle groups bilaterally. No pain crepitus or  joint limitation noted with ROM b/l. No gross bony deformities bilaterally. Patient ambulates independent of any assistive aids. ? ?Neurological:  ?Protective sensation intact 5/5 intact bilaterally with 10g monofilament b/l. Vibratory sensation intact b/l. Proprioception intact bilaterally. Babinski reflex negative b/l. Clonus negative b/l. ? ?Assessment/Plan: ?1. Pain due to onychomycosis of toenails of both feet   ?2. Ingrown toenail without infection   ?3. Porokeratosis   ?4. Pain in both feet   ?  ?-Patient was evaluated and treated. All patient's and/or POA's questions/concerns answered on today's visit. ?-Patient to continue soft, supportive shoe gear daily. ?-Mycotic toenails 1-5 bilaterally were debrided in length and girth with sterile nail nippers and dremel without incident. ?-Offending nail border debrided and curretaged left great toe utilizing sterile nail nipper and currette. Border(s) cleansed with alcohol and triple antibiotic ointment applied. Dispensed written instructions for once daily epsom salt soaks for 7 days. ?-Painful porokeratotic lesion(s) submet head 5 b/l pared and enucleated with sterile scalpel blade without incident. Total number of lesions debrided=2. ?-Patient/POA to call should there be question/concern in the interim.  ? ?Return in about 9 weeks (around 06/15/2021). ? ?Marzetta Board, DPM  ?

## 2021-04-20 ENCOUNTER — Ambulatory Visit
Admission: EM | Admit: 2021-04-20 | Discharge: 2021-04-20 | Disposition: A | Discharge status: Home / Self Care | Payer: 59 | Attending: Student | Admitting: Student

## 2021-04-20 DIAGNOSIS — S46812A Strain of other muscles, fascia and tendons at shoulder and upper arm level, left arm, initial encounter: Secondary | ICD-10-CM

## 2021-04-20 MED ORDER — TIZANIDINE HCL 2 MG PO CAPS: 2.0000 mg | ORAL_CAPSULE | Freq: Three times a day (TID) | ORAL | 0 refills | Status: AC

## 2021-04-20 NOTE — Discharge Instructions (Addendum)
-  Start the muscle relaxer-Zanaflex (tizanidine), up to 3 times daily for muscle spasms and pain.  This can make you drowsy, so take at bedtime or when you do not need to drive or operate machinery. ?-You can take Tylenol up to 1000 mg 3 times daily, and ibuprofen up to 600 mg 3 times daily with food.  You can take these together, or alternate every 3-4 hours. ?-Heating pad, rest, gentle stretching  ?-Follow-up if the symptoms change: chest pain, shortness of breath, etc.  ?

## 2021-04-20 NOTE — ED Triage Notes (Signed)
Pt c/o left shoulder pain. Unable to identify any direct injury or trauma to the area. States yesterday when he woke up it was in severe pain. States it hurts constantly, and is worse when trying to move it. Limited ROM.  ?

## 2021-04-20 NOTE — ED Provider Notes (Signed)
?[facility] ? ? ? ?CSN: [id number] ?Arrival date & time: 04/20/21  0830 ? ? ?  ? ?History   ?Chief Complaint ?Chief Complaint  ?Patient presents with  ? left shoulder pain  ? ? ?HPI ?Andrew Mcbride is a 51 y.o. male presenting with left shoulder pain for about 1 day.  History noncontributory.  Patient describes pain over the left posterior shoulder, states he woke up like this.  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  He is right-handed.  Denies radiation of the pain.  Denies associated chest pain, shortness of breath, jaw pain, pain radiating down the left arm.  Has not attempted interventions at home. ? ?HPI ? ?Past Medical History:  ?Diagnosis Date  ? Anxiety   ? Chest pain   ? Chronic back pain   ? Chronic leg pain   ? left  ? Palpitation   ? ? ?Patient Active Problem List  ? Diagnosis Date Noted  ? Tobacco abuse 05/03/2014  ? Palpitations 05/03/2014  ? Chest pain 05/02/2014  ? Anxiety 05/02/2014  ? ? ?History reviewed. No pertinent surgical history. ? ? ? ? ?Home Medications   ? ?Prior to Admission medications   ?Medication Sig Start Date End Date Taking? Authorizing Provider  ?tizanidine (ZANAFLEX) 2 MG capsule Take 1 capsule (2 mg total) by mouth 3 (three) times daily. 04/20/21  Yes Rhys Martini, PA-C  ?cetirizine (ZYRTEC) 10 MG tablet Take 1 tablet (10 mg total) by mouth daily. 03/14/15 12/23/18  Wallis Bamberg, PA-C  ?omeprazole (PRILOSEC) 40 MG capsule Take 1 capsule (40 mg total) by mouth daily. 09/07/14 12/23/18  [name], MD  ?pantoprazole (PROTONIX) 40 MG tablet TAKE 1 TABLET BY MOUTH ONCE DAILY FOR 90 DAYS 12/22/17 12/23/18  [provider]  ? ? ?Family History ?Family History  ?Problem Relation Age of Onset  ? Hyperlipidemia Mother   ? Heart attack Mother   ? Lung cancer Father   ? ? ?Social History ?Social History  ? ?Tobacco Use  ? Smoking status: Some Days  ?  Types: Cigars  ? Smokeless tobacco: Never  ?Vaping Use  ? Vaping Use: Former   ?Substance Use Topics  ? Alcohol use: Yes  ?  Comment: occ  ? Drug use: Yes  ?  Types: Marijuana  ? ? ? ?Allergies   ?Patient has no known allergies. ? ? ?Review of Systems ?Review of Systems  ?Musculoskeletal:   ?     L shoulder pain   ?All other systems reviewed and are negative. ? ? ?Physical Exam ?Triage Vital Signs ?ED Triage Vitals  ?Enc Vitals Group  ?   BP 04/20/21 0846 126/80  ?   Pulse Rate 04/20/21 0845 66  ?   Resp 04/20/21 0845 18  ?   Temp 04/20/21 0845 98.2 ?F (36.8 ?C)  ?   Temp Source 04/20/21 0845 Oral  ?   SpO2 04/20/21 0845 97 %  ?   Weight --   ?   Height --   ?   Head Circumference --   ?   Peak Flow --   ?   Pain Score 04/20/21 0845 0  ?   Pain Loc --   ?   Pain Edu? --   ?   Excl. in GC? --   ? ?No data found. ? ?Updated Vital Signs ?BP 126/80 (BP Location: Right Arm)   Pulse 66   Temp 98.2 ?F (  36.8 ?C) (Oral)   Resp 18   SpO2 97%  ? ?Visual Acuity ?Right Eye Distance:   ?Left Eye Distance:   ?Bilateral Distance:   ? ?Right Eye Near:   ?Left Eye Near:    ?Bilateral Near:    ? ?Physical Exam ?Vitals reviewed.  ?Constitutional:   ?   General: He is not in acute distress. ?   Appearance: Normal appearance. He is not ill-appearing or diaphoretic.  ?HENT:  ?   Head: Normocephalic and atraumatic.  ?Cardiovascular:  ?   Rate and Rhythm: Normal rate and regular rhythm.  ?   Heart sounds: Normal heart sounds.  ?Pulmonary:  ?   Effort: Pulmonary effort is normal.  ?   Breath sounds: Normal breath sounds.  ?Musculoskeletal:  ?   Comments: L proximal trapezius with hypertonicity and tenderness to palpation. ROM abduction and crossbody adduction intact but with pain. No AC joint or shoulder jointline tenderness. Strength and sensation grossly intact upper extremities. No midline cervical spinous tenderness, deformity, stepoff.   ?Skin: ?   General: Skin is warm.  ?Neurological:  ?   General: No focal deficit present.  ?   Mental Status: He is alert and oriented to person, place, and time.   ?Psychiatric:     ?   Mood and Affect: Mood normal.     ?   Behavior: Behavior normal.     ?   Thought Content: Thought content normal.     ?   Judgment: Judgment normal.  ? ? ? ?UC Treatments / Results  ?Labs ?(all labs ordered are listed, but only abnormal results are displayed) ?Labs Reviewed - No data to display ? ?EKG ? ? ?Radiology ?No results found. ? ?Procedures ?Procedures (including critical care time) ? ?Medications Ordered in UC ?Medications - No data to display ? ?Initial Impression / Assessment and Plan / UC Course  ?I have reviewed the triage vital signs and the nursing notes. ? ?Pertinent labs & imaging results that were available during my care of the patient were reviewed by me and considered in my medical decision making (see chart for details). ? ?  ? ?This patient is a very pleasant 51 y.o. year old male presenting with L proximal trapezius strain. Neurovascularly intact. Pain is reproducible. Trial of zanaflex, ibuprofen, heating pad. ED return precautions discussed. Patient verbalizes understanding and agreement.  ?.  ? ?Final Clinical Impressions(s) / UC Diagnoses  ? ?Final diagnoses:  ?Strain of left trapezius muscle, initial encounter  ? ? ? ?Discharge Instructions   ? ?  ?-Start the muscle relaxer-Zanaflex (tizanidine), up to 3 times daily for muscle spasms and pain.  This can make you drowsy, so take at bedtime or when you do not need to drive or operate machinery. ?-You can take Tylenol up to 1000 mg 3 times daily, and ibuprofen up to 600 mg 3 times daily with food.  You can take these together, or alternate every 3-4 hours. ?-Heating pad, rest, gentle stretching  ?-Follow-up if the symptoms change: chest pain, shortness of breath, etc.  ? ? ?ED Prescriptions   ? ? Medication Sig Dispense Auth. Provider  ? tizanidine (ZANAFLEX) 2 MG capsule Take 1 capsule (2 mg total) by mouth 3 (three) times daily. 21 capsule Rhys Martini, PA-C  ? ?  ? ?PDMP not reviewed this encounter. ?  ?Rhys Martini, PA-C ?04/20/21 8921 ? ?

## 2021-04-22 ENCOUNTER — Emergency Department (HOSPITAL_COMMUNITY): Payer: 59

## 2021-04-22 ENCOUNTER — Other Ambulatory Visit: Payer: Self-pay

## 2021-04-22 ENCOUNTER — Encounter (HOSPITAL_COMMUNITY): Payer: Self-pay | Admitting: Emergency Medicine

## 2021-04-22 ENCOUNTER — Emergency Department (HOSPITAL_COMMUNITY)
Admission: EM | Admit: 2021-04-22 | Discharge: 2021-04-22 | Disposition: A | Discharge status: Home / Self Care | Payer: 59 | Attending: Emergency Medicine | Admitting: Emergency Medicine

## 2021-04-22 DIAGNOSIS — M25512 Pain in left shoulder: Secondary | ICD-10-CM | POA: Insufficient documentation

## 2021-04-22 DIAGNOSIS — M19012 Primary osteoarthritis, left shoulder: Secondary | ICD-10-CM | POA: Diagnosis not present

## 2021-04-22 MED ORDER — IBUPROFEN 200 MG PO TABS
600.0000 mg | ORAL_TABLET | Freq: Once | ORAL | Status: AC
  Administered 2021-04-22: 600 mg via ORAL
  Filled 2021-04-22: qty 1

## 2021-04-22 MED ORDER — OXYCODONE HCL 5 MG PO TABS: 5.0000 mg | ORAL_TABLET | Freq: Four times a day (QID) | ORAL | 0 refills | Status: AC | PRN

## 2021-04-22 MED ORDER — IBUPROFEN 600 MG PO TABS: 600.0000 mg | ORAL_TABLET | Freq: Four times a day (QID) | ORAL | 0 refills | Status: AC | PRN

## 2021-04-22 MED ORDER — HYDROCODONE-ACETAMINOPHEN 5-325 MG PO TABS
1.0000 | ORAL_TABLET | Freq: Once | ORAL | Status: AC
  Administered 2021-04-22: 1 via ORAL
  Filled 2021-04-22: qty 1

## 2021-04-22 NOTE — ED Notes (Signed)
Pt verbalizes understanding of discharge instructions. Opportunity for questions and answers were provided. Pt discharged from the ED.   ?

## 2021-04-22 NOTE — ED Notes (Signed)
Patient transported to X-ray 

## 2021-04-22 NOTE — ED Provider Notes (Signed)
?[facility] EMERGENCY DEPARTMENT ?Provider Note ? ? ?CSN: [id number] ?Arrival date & time: 04/22/21  0935 ? ?  ? ?History ? ?Chief Complaint  ?Patient presents with  ? Shoulder Pain  ? ? ?Andrew Mcbride is a 51 y.o. male. ? ?Patient is a 51 year old male presenting for shoulder pain.  Patient admits to left-sided shoulder pain worse with lifting the arm above the shoulder joint 3 days.  Denies any falls or injuries.  Admits to overuse at work.  Denies any open lacerations or abrasions.  Sensation or motor deficits in the left arm. ? ?The history is provided by the patient. No language interpreter was used.  ?Shoulder Pain ?Associated symptoms: no back pain and no fever   ? ?  ? ?Home Medications ?Prior to Admission medications   ?Medication Sig Start Date End Date Taking? Authorizing Provider  ?tizanidine (ZANAFLEX) 2 MG capsule Take 1 capsule (2 mg total) by mouth 3 (three) times daily. 04/20/21   Rhys Martini, PA-C  ?cetirizine (ZYRTEC) 10 MG tablet Take 1 tablet (10 mg total) by mouth daily. 03/14/15 12/23/18  Wallis Bamberg, PA-C  ?omeprazole (PRILOSEC) 40 MG capsule Take 1 capsule (40 mg total) by mouth daily. 09/07/14 12/23/18  [name], MD  ?pantoprazole (PROTONIX) 40 MG tablet TAKE 1 TABLET BY MOUTH ONCE DAILY FOR 90 DAYS 12/22/17 12/23/18  [provider]  ?   ? ?Allergies    ?Patient has no known allergies.   ? ?Review of Systems   ?Review of Systems  ?Constitutional:  Negative for chills and fever.  ?HENT:  Negative for ear pain and sore throat.   ?Eyes:  Negative for pain and visual disturbance.  ?Respiratory:  Negative for cough and shortness of breath.   ?Cardiovascular:  Negative for chest pain and palpitations.  ?Gastrointestinal:  Negative for abdominal pain and vomiting.  ?Genitourinary:  Negative for dysuria and hematuria.  ?Musculoskeletal:  Negative for arthralgias and back pain.  ?Skin:  Negative for color change and rash.  ?Neurological:  Negative for seizures and  syncope.  ?All other systems reviewed and are negative. ? ?Physical Exam ?Updated Vital Signs ?BP 116/68   Pulse 64   Temp 98.2 ?F (36.8 ?C) (Oral)   Resp 17   Ht 6' (1.829 m)   Wt 86.2 kg   SpO2 98%   BMI 25.77 kg/m?  ?Physical Exam ?Vitals and nursing note reviewed.  ?Constitutional:   ?   Appearance: Normal appearance.  ?HENT:  ?   Head: Normocephalic and atraumatic.  ?Cardiovascular:  ?   Rate and Rhythm: Normal rate and regular rhythm.  ?   Pulses:     ?     Radial pulses are 2+ on the right side and 2+ on the left side.  ?Pulmonary:  ?   Effort: Pulmonary effort is normal.  ?   Breath sounds: Normal breath sounds.  ?Musculoskeletal:  ?   Right shoulder: Normal.  ?   Left shoulder: Tenderness present. Normal pulse.  ?   Right upper arm: Normal.  ?   Left upper arm: Normal.  ?   Right elbow: Normal.  ?   Left elbow: Normal.  ?   Right forearm: Normal.  ?   Left forearm: Normal.  ?   Comments: Tenderness to palpation of left anterior glenohumeral joint. Difficulty lateral Abduction at shoulder above 90 degrees. Pain with external rotation against resistance (Hornblower's sign)  ?Skin: ?   General: Skin is warm and dry.  ?  Capillary Refill: Capillary refill takes less than 2 seconds.  ?   Findings: No rash.  ?Neurological:  ?   General: No focal deficit present.  ?   Mental Status: He is alert and oriented to person, place, and time.  ?   GCS: GCS eye subscore is 4. GCS verbal subscore is 5. GCS motor subscore is 6.  ?   Sensory: Sensation is intact.  ?   Motor: Motor function is intact.  ? ? ?ED Results / Procedures / Treatments   ?Labs ?(all labs ordered are listed, but only abnormal results are displayed) ?Labs Reviewed - No data to display ? ?EKG ?None ? ?Radiology ?No results found. ? ?Procedures ?Procedures  ? ? ?Medications Ordered in ED ?Medications  ?HYDROcodone-acetaminophen (NORCO/VICODIN) 5-325 MG per tablet 1 tablet (has no administration in time range)  ?ibuprofen (ADVIL) tablet 600 mg (has  no administration in time range)  ? ? ?ED Course/ Medical Decision Making/ A&P ?  ?                        ?Medical Decision Making ?Amount and/or Complexity of Data Reviewed ?Radiology: ordered. ? ?Risk ?Prescription drug management. ? ? ?10:27 AM ?Patient is a 51 year old male presenting for shoulder pain.  He is alert and oriented x3, no acute distress, afebrile, stable vital signs.  Physical exam demonstrates Tenderness to palpation of left anterior glenohumeral joint. Difficulty lateral Abduction at shoulder above 90 degrees. Pain with external rotation against resistance (Hornblower's sign) concerning for rotator cuff tear.  Strays demonstrate no acute process.  Patient given medication for analgesia recommendations for close follow-up with orthopedic surgery for further evaluation. ? ?Patient in no distress and overall condition improved here in the ED. Detailed discussions were had with the patient regarding current findings, and need for close f/u with orthopedics.  The patient has been instructed to return immediately if the symptoms worsen in any way for re-evaluation. Patient verbalized understanding and is in agreement with current care plan. All questions answered prior to discharge. ? ? ? ? ? ? ? ? ?Final Clinical Impression(s) / ED Diagnoses ?Final diagnoses:  ?Acute pain of left shoulder  ? ? ?Rx / DC Orders ?ED Discharge Orders   ? ? None  ? ?  ? ? ?  ?Franne Forts, DO ?04/25/21 1109 ? ?

## 2021-04-22 NOTE — ED Triage Notes (Signed)
Pt arrives c/o L shoulder pain, radiating down L arm, into chest, and into back. Pt states pain started Thursday, states he might have injured it at work. Pt states pain is constant, sharp, and throbbing. A/ox 4. Appears in NAD.  ?

## 2021-04-22 NOTE — Discharge Instructions (Addendum)
Concerns for rotator cuff tear.  Please follow-up with orthopedic surgeon make an appointment first thing Monday morning for further evaluation of shoulder.  Mentations take Motrin 600 mg every 6 hours to decrease inflammation and pain. ?

## 2021-04-27 DIAGNOSIS — M25512 Pain in left shoulder: Secondary | ICD-10-CM | POA: Diagnosis not present

## 2021-04-27 DIAGNOSIS — M542 Cervicalgia: Secondary | ICD-10-CM | POA: Diagnosis not present

## 2021-04-27 DIAGNOSIS — M47812 Spondylosis without myelopathy or radiculopathy, cervical region: Secondary | ICD-10-CM | POA: Insufficient documentation

## 2021-06-18 ENCOUNTER — Ambulatory Visit: Payer: 59 | Admitting: Podiatry

## 2021-06-20 ENCOUNTER — Ambulatory Visit: Payer: 59 | Admitting: Podiatry

## 2021-06-20 ENCOUNTER — Encounter: Payer: Self-pay | Admitting: Podiatry

## 2021-06-20 DIAGNOSIS — M79671 Pain in right foot: Secondary | ICD-10-CM | POA: Diagnosis not present

## 2021-06-20 DIAGNOSIS — M79672 Pain in left foot: Secondary | ICD-10-CM | POA: Diagnosis not present

## 2021-06-20 DIAGNOSIS — Q828 Other specified congenital malformations of skin: Secondary | ICD-10-CM | POA: Diagnosis not present

## 2021-06-20 DIAGNOSIS — B351 Tinea unguium: Secondary | ICD-10-CM | POA: Diagnosis not present

## 2021-06-20 DIAGNOSIS — M79675 Pain in left toe(s): Secondary | ICD-10-CM | POA: Diagnosis not present

## 2021-06-20 DIAGNOSIS — M79674 Pain in right toe(s): Secondary | ICD-10-CM

## 2021-06-24 NOTE — Progress Notes (Signed)
  Subjective:  Patient ID: Andrew Mcbride, male    DOB: 10/27/1970,  MRN: [id number]  Keonte Daubenspeck presents to clinic today for painful porokeratotic lesion(s) b/l feet and painful mycotic toenails that limit ambulation. Painful toenails interfere with ambulation. Aggravating factors include wearing enclosed shoe gear. Pain is relieved with periodic professional debridement. Painful porokeratotic lesions are aggravated when weightbearing with and without shoegear. Pain is relieved with periodic professional debridement.  New problem(s): None.   PCP is [name], MD.  No Known Allergies  Review of Systems: Negative except as noted in the HPI.  Objective:  General: Andrew Mcbride is a pleasant 51 y.o. African American male, in NAD. AAO x 3.   Vascular:  Capillary fill time to digits <3 seconds b/l lower extremities. Palpable DP pulses b/l. Palpable PT pulses b/l. Pedal hair sparse b/l. Skin temperature gradient within normal limits b/l. No pain with calf compression b/l. No edema noted b/l.  Dermatological:  Pedal skin with normal turgor, texture and tone bilaterally. No open wounds bilaterally. No interdigital macerations bilaterally. Toenails 1-5 b/l elongated, discolored, dystrophic, thickened, crumbly with subungual debris and tenderness to dorsal palpation. Porokeratotic lesion(s) submet head 5 left foot and submet head 5 right foot. No erythema, no edema, no drainage, no flocculence.   Musculoskeletal:  Normal muscle strength 5/5 to all lower extremity muscle groups bilaterally. No pain crepitus or joint limitation noted with ROM b/l. No gross bony deformities bilaterally. Patient ambulates independent of any assistive aids.  Neurological:  Protective sensation intact 5/5 intact bilaterally with 10g monofilament b/l. Vibratory sensation intact b/l. Proprioception intact bilaterally. Babinski reflex negative b/l. Clonus negative b/l.  Assessment/Plan: 1. Pain due to onychomycosis of  toenails of both feet   2. Porokeratosis   3. Pain in both feet      -Examined patient. -Toenails 1-5 b/l were debrided in length and girth with sterile nail nippers and dremel without iatrogenic bleeding.  -Porokeratotic lesion(s) submet head 5 b/l pared and enucleated with sterile scalpel blade without incident. Total number of lesions debrided=2. -Patient/POA to call should there be question/concern in the interim.   Return in about 9 weeks (around 08/22/2021).  Freddie Breech, DPM

## 2021-09-28 ENCOUNTER — Ambulatory Visit: Payer: 59 | Admitting: Podiatry

## 2021-09-28 ENCOUNTER — Encounter: Payer: Self-pay | Admitting: Podiatry

## 2021-09-28 DIAGNOSIS — Q828 Other specified congenital malformations of skin: Secondary | ICD-10-CM | POA: Diagnosis not present

## 2021-09-28 DIAGNOSIS — B351 Tinea unguium: Secondary | ICD-10-CM | POA: Diagnosis not present

## 2021-09-28 DIAGNOSIS — M79674 Pain in right toe(s): Secondary | ICD-10-CM | POA: Diagnosis not present

## 2021-09-28 DIAGNOSIS — M79675 Pain in left toe(s): Secondary | ICD-10-CM

## 2021-09-28 DIAGNOSIS — M79671 Pain in right foot: Secondary | ICD-10-CM | POA: Diagnosis not present

## 2021-09-28 DIAGNOSIS — M79672 Pain in left foot: Secondary | ICD-10-CM

## 2021-09-28 NOTE — Progress Notes (Signed)
  Subjective:  Patient ID: Andrew Mcbride, male    DOB: 22-Oct-1970,  MRN: [id number]  Andrew Mcbride presents to clinic today for painful porokeratotic lesion(s) b/l lower extremities and painful mycotic toenails that limit ambulation. Painful toenails interfere with ambulation. Aggravating factors include wearing enclosed shoe gear. Pain is relieved with periodic professional debridement. Painful porokeratotic lesions are aggravated when weightbearing with and without shoegear. Pain is relieved with periodic professional debridement.  New problem(s): None.   PCP is [name], MD , and last visit was in 2022, per patient recollection.  No Known Allergies  Review of Systems: Negative except as noted in the HPI.  Objective: No changes noted in today's physical examination. Andrew Mcbride is a pleasant 51 y.o. male in NAD. AAO x 3.  Vascular:  Capillary fill time to digits <3 seconds b/l lower extremities. Palpable DP pulses b/l. Palpable PT pulses b/l. Pedal hair sparse b/l. Skin temperature gradient within normal limits b/l. No pain with calf compression b/l. No edema noted b/l.  Dermatological:  Pedal skin with normal turgor, texture and tone bilaterally. No open wounds bilaterally. No interdigital macerations bilaterally. Toenails 1-5 b/l elongated, discolored, dystrophic, thickened, crumbly with subungual debris and tenderness to dorsal palpation.   Porokeratotic lesion(s) submet head 5 left foot and submet head 5 right foot. No erythema, no edema, no drainage, no flocculence.   Musculoskeletal:  Normal muscle strength 5/5 to all lower extremity muscle groups bilaterally. No pain crepitus or joint limitation noted with ROM b/l. No gross bony deformities bilaterally. Patient ambulates independent of any assistive aids.  Neurological:  Protective sensation intact 5/5 intact bilaterally with 10g monofilament b/l. Vibratory sensation intact b/l. Proprioception intact bilaterally. Babinski  reflex negative b/l. Clonus negative b/l. Assessment/Plan: 1. Pain due to onychomycosis of toenails of both feet   2. Porokeratosis   3. Pain in both feet     -Examined patient. -No new findings. No new orders. -Patient to continue soft, supportive shoe gear daily. -Toenails 1-5 bilaterally debrided in length and girth without iatrogenic bleeding with sterile nail nipper and dremel.  -Porokeratotic lesion(s) submet head 5 b/l pared and enucleated with sterile scalpel blade without incident. Total number of lesions debrided=2. -Recommended O[employer]os [employer]ez[employer] Low shoes with stretchable uppers and memory foam insoles. -Patient/POA to call should there be question/concern in the interim.   Return in about 11 weeks (around 12/14/2021).  Andrew Mcbride, DPM

## 2021-12-14 ENCOUNTER — Ambulatory Visit: Payer: 59 | Admitting: Podiatry

## 2021-12-14 DIAGNOSIS — B351 Tinea unguium: Secondary | ICD-10-CM | POA: Diagnosis not present

## 2021-12-14 DIAGNOSIS — M79672 Pain in left foot: Secondary | ICD-10-CM | POA: Diagnosis not present

## 2021-12-14 DIAGNOSIS — M79671 Pain in right foot: Secondary | ICD-10-CM | POA: Diagnosis not present

## 2021-12-14 DIAGNOSIS — Q828 Other specified congenital malformations of skin: Secondary | ICD-10-CM | POA: Diagnosis not present

## 2021-12-14 DIAGNOSIS — M79674 Pain in right toe(s): Secondary | ICD-10-CM | POA: Diagnosis not present

## 2021-12-14 DIAGNOSIS — M79675 Pain in left toe(s): Secondary | ICD-10-CM

## 2021-12-14 NOTE — Progress Notes (Unsigned)
  Subjective:  Patient ID: Andrew Mcbride, male    DOB: [birth year],  MRN: [id number]  Andrew Mcbride presents to clinic today for painful porokeratotic lesion(s) both feet and painful mycotic toenails that limit ambulation. Painful toenails interfere with ambulation. Aggravating factors include wearing enclosed shoe gear. Pain is relieved with periodic professional debridement. Painful porokeratotic lesions are aggravated when weightbearing with and without shoegear. Pain is relieved with periodic professional debridement.  No chief complaint on file.  New problem(s): None.   PCP is [name], MD.  No Known Allergies  Review of Systems: Negative except as noted in the HPI.  Objective: No changes noted in today's physical examination.  Andrew Mcbride is a pleasant 51 y.o. male WD, WN in NAD. AAO x 3.  Vascular:  Capillary fill time to digits <3 seconds b/l lower extremities. Palpable DP pulses b/l. Palpable PT pulses b/l. Pedal hair sparse b/l. Skin temperature gradient within normal limits b/l. No pain with calf compression b/l. No edema noted b/l.  Dermatological:  Pedal skin with normal turgor, texture and tone bilaterally. No open wounds bilaterally. No interdigital macerations bilaterally. Toenails 1-5 b/l elongated, discolored, dystrophic, thickened, crumbly with subungual debris and tenderness to dorsal palpation.   Porokeratotic lesion(s) submet head 5 left foot and submet head 5 right foot. No erythema, no edema, no drainage, no flocculence.   Musculoskeletal:  Normal muscle strength 5/5 to all lower extremity muscle groups bilaterally. No pain crepitus or joint limitation noted with ROM b/l. No gross bony deformities bilaterally. Patient ambulates independent of any assistive aids.  Neurological:  Protective sensation intact 5/5 intact bilaterally with 10g monofilament b/l. Vibratory sensation intact b/l. Proprioception intact bilaterally. Babinski reflex negative b/l. Clonus  negative b/l.  Assessment/Plan: 1. Pain due to onychomycosis of toenails of both feet   2. Porokeratosis   3. Pain in both feet     No orders of the defined types were placed in this encounter.   -Patient was evaluated and treated. All patient's and/or POA's questions/concerns answered on today's visit. -Consent given for treatment as described below: -Continue supportive shoe gear daily. -Mycotic toenails 1-5 bilaterally were debrided in length and girth with sterile nail nippers and dremel without incident. -Porokeratotic lesion(s) submet head 5 b/l pared and enucleated with sterile currette without incident. Total number of lesions debrided=2. -Patient/POA to call should there be question/concern in the interim.   Return in about 9 weeks (around 02/15/2022).  Freddie Breech, DPM

## 2021-12-15 ENCOUNTER — Encounter: Payer: Self-pay | Admitting: Podiatry

## 2022-01-11 DIAGNOSIS — Z1211 Encounter for screening for malignant neoplasm of colon: Secondary | ICD-10-CM | POA: Diagnosis not present

## 2022-01-11 DIAGNOSIS — L309 Dermatitis, unspecified: Secondary | ICD-10-CM | POA: Diagnosis not present

## 2022-01-11 DIAGNOSIS — R229 Localized swelling, mass and lump, unspecified: Secondary | ICD-10-CM | POA: Diagnosis not present

## 2022-01-11 DIAGNOSIS — Z0000 Encounter for general adult medical examination without abnormal findings: Secondary | ICD-10-CM | POA: Diagnosis not present

## 2022-02-15 ENCOUNTER — Ambulatory Visit: Payer: 59 | Admitting: Podiatry

## 2022-02-18 ENCOUNTER — Encounter: Payer: Self-pay | Admitting: Podiatry

## 2022-02-18 ENCOUNTER — Ambulatory Visit: Payer: 59 | Admitting: Podiatry

## 2022-02-18 DIAGNOSIS — M79674 Pain in right toe(s): Secondary | ICD-10-CM | POA: Diagnosis not present

## 2022-02-18 DIAGNOSIS — Q828 Other specified congenital malformations of skin: Secondary | ICD-10-CM

## 2022-02-18 DIAGNOSIS — B351 Tinea unguium: Secondary | ICD-10-CM

## 2022-02-18 DIAGNOSIS — M79675 Pain in left toe(s): Secondary | ICD-10-CM

## 2022-02-18 NOTE — Progress Notes (Signed)
  Subjective:  Patient ID: Andrew Mcbride, male    DOB: [birth year],   MRN: [id number]  Chief Complaint  Patient presents with   Nail Problem    Routine foot care    52 y.o. male presents for concern of thickened elongated and painful nails that are difficult to trim. Requesting to have them trimmed today. Relates burning and tingling in their feet. Patient is diabetic and last A1c was No results found for: "HGBA1C" .   PCP:  London Pepper, MD    . Denies any other pedal complaints. Denies n/v/f/c.   Past Medical History:  Diagnosis Date   Anxiety    Chest pain    Chronic back pain    Chronic leg pain    left   Palpitation     Objective:  Physical Exam: Vascular: DP/PT pulses 2/4 bilateral. CFT <3 seconds. Absent hair growth on digits. Edema noted to bilateral lower extremities. Xerosis noted bilaterally.  Skin. No lacerations or abrasions bilateral feet. Nails 1-5 bilateral  are thickened discolored and elongated with subungual debris. Hyperkeratotic cored lesion noted sub fifth metatarsal on  bilateral feet.  Musculoskeletal: MMT 5/5 bilateral lower extremities in DF, PF, Inversion and Eversion. Deceased ROM in DF of ankle joint. Plantar flexed first metatarsal.  Neurological: Sensation intact to light touch. Protective sensation diminished bilateral.    Assessment:   1. Pain due to onychomycosis of toenails of both feet   2. Porokeratosis      Plan:  Patient was evaluated and treated and all questions answered. -Discussed and educated patient on diabetic foot care, especially with  regards to the vascular, neurological and musculoskeletal systems.  -Stressed the importance of good glycemic control and the detriment of not  controlling glucose levels in relation to the foot. -Discussed supportive shoes at all times and checking feet regularly.  -Mechanically debrided all nails 1-5 bilateral using sterile nail nipper and filed with dremel without incident  -Hyperkeratotic  tissue debrided without incident.  -Did discuss surgery to help with the calluses areas on feet due to his plantarflexed metatarsals. Discussed perioperative course. 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  -Answered all patient questions -Patient to return  in 9 weeks for at risk foot care -Patient advised to call the office if any problems or questions arise in the meantime.   Lorenda Peck, DPM

## 2022-04-19 ENCOUNTER — Ambulatory Visit: Payer: 59 | Admitting: Podiatry

## 2022-04-22 ENCOUNTER — Encounter: Payer: Self-pay | Admitting: Podiatry

## 2022-04-22 ENCOUNTER — Ambulatory Visit: Payer: 59 | Admitting: Podiatry

## 2022-04-22 DIAGNOSIS — M79675 Pain in left toe(s): Secondary | ICD-10-CM | POA: Diagnosis not present

## 2022-04-22 DIAGNOSIS — B351 Tinea unguium: Secondary | ICD-10-CM | POA: Diagnosis not present

## 2022-04-22 DIAGNOSIS — M79674 Pain in right toe(s): Secondary | ICD-10-CM

## 2022-04-22 DIAGNOSIS — Q828 Other specified congenital malformations of skin: Secondary | ICD-10-CM

## 2022-04-22 NOTE — Progress Notes (Signed)
  Subjective:  Patient ID: Andrew Mcbride, male    DOB: 05/11/1970,   MRN: [id number]  No chief complaint on file.   52 y.o. male presents for concern of thickened elongated and painful nails that are difficult to trim. Requesting to have them trimmed today. Denies  burning and tingling in their feet. Patient is not diabetic.   PCP:  [name], MD    . Denies any other pedal complaints. Denies n/v/f/c.   Past Medical History:  Diagnosis Date   Anxiety    Chest pain    Chronic back pain    Chronic leg pain    left   Palpitation     Objective:  Physical Exam: Vascular: DP/PT pulses 2/4 bilateral. CFT <3 seconds. Absent hair growth on digits. Edema noted to bilateral lower extremities. Xerosis noted bilaterally.  Skin. No lacerations or abrasions bilateral feet. Nails 1-5 bilateral  are thickened discolored and elongated with subungual debris. Hyperkeratotic cored lesion noted sub fifth metatarsal on  bilateral feet.  Musculoskeletal: MMT 5/5 bilateral lower extremities in DF, PF, Inversion and Eversion. Deceased ROM in DF of ankle joint. Plantar flexed first metatarsal.  Neurological: Sensation intact to light touch. Protective sensation diminished bilateral.    Assessment:   1. Pain due to onychomycosis of toenails of both feet   2. Porokeratosis       Plan:  Patient was evaluated and treated and all questions answered. -Discussed and educated patient on diabetic foot care, especially with  regards to the vascular, neurological and musculoskeletal systems.  -Stressed the importance of good glycemic control and the detriment of not  controlling glucose levels in relation to the foot. -Discussed supportive shoes at all times and checking feet regularly.  -Mechanically debrided all nails 1-5 bilateral using sterile nail nipper and filed with dremel without incident  -Hyperkeratotic tissue debrided without incident.  -Did discuss surgery to help with the calluses areas on  feet due to his plantarflexed metatarsals. Discussed perioperative course. 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  -Answered all patient questions -Patient to return  in 9 weeks for at risk foot care -Patient advised to call the office if any problems or questions arise in the meantime.   Louann Sjogren, DPM

## 2022-06-24 ENCOUNTER — Ambulatory Visit: Payer: 59 | Admitting: Podiatry

## 2022-06-24 ENCOUNTER — Encounter: Payer: Self-pay | Admitting: Podiatry

## 2022-06-24 DIAGNOSIS — M79674 Pain in right toe(s): Secondary | ICD-10-CM | POA: Diagnosis not present

## 2022-06-24 DIAGNOSIS — M79675 Pain in left toe(s): Secondary | ICD-10-CM

## 2022-06-24 DIAGNOSIS — B351 Tinea unguium: Secondary | ICD-10-CM

## 2022-06-24 DIAGNOSIS — Q828 Other specified congenital malformations of skin: Secondary | ICD-10-CM

## 2022-06-24 NOTE — Progress Notes (Signed)
  Subjective:  Patient ID: Andrew Mcbride, male    DOB: [birth year],   MRN: [id number]  Chief Complaint  Patient presents with   Nail Problem     Routine foot care    52 y.o. male presents for concern of thickened elongated and painful nails that are difficult to trim. Requesting to have them trimmed today. Denies  burning and tingling in their feet. Patient is not diabetic.   PCP:  [name], MD    . Denies any other pedal complaints. Denies n/v/f/c.   Past Medical History:  Diagnosis Date   Anxiety    Chest pain    Chronic back pain    Chronic leg pain    left   Palpitation     Objective:  Physical Exam: Vascular: DP/PT pulses 2/4 bilateral. CFT <3 seconds. Absent hair growth on digits. Edema noted to bilateral lower extremities. Xerosis noted bilaterally.  Skin. No lacerations or abrasions bilateral feet. Nails 1-5 bilateral  are thickened discolored and elongated with subungual debris. Hyperkeratotic cored lesion noted sub fifth metatarsal on  bilateral feet.  Musculoskeletal: MMT 5/5 bilateral lower extremities in DF, PF, Inversion and Eversion. Deceased ROM in DF of ankle joint. Plantar flexed first metatarsal.  Neurological: Sensation intact to light touch. Protective sensation diminished bilateral.    Assessment:   1. Pain due to onychomycosis of toenails of both feet   2. Porokeratosis        Plan:  Patient was evaluated and treated and all questions answered. -Discussed and educated patient on diabetic foot care, especially with  regards to the vascular, neurological and musculoskeletal systems.  -Stressed the importance of good glycemic control and the detriment of not  controlling glucose levels in relation to the foot. -Discussed supportive shoes at all times and checking feet regularly.  -Mechanically debrided all nails 1-5 bilateral using sterile nail nipper and filed with dremel without incident  -Hyperkeratotic tissue debrided without incident.   -Did discuss surgery to help with the calluses areas on feet due to his plantarflexed metatarsals. Discussed perioperative course. 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  -Answered all patient questions -Patient to return  in 9 weeks for at risk foot care -Patient advised to call the office if any problems or questions arise in the meantime.   Louann Sjogren, DPM

## 2022-06-24 NOTE — Progress Notes (Signed)
20

## 2022-09-02 ENCOUNTER — Ambulatory Visit (INDEPENDENT_AMBULATORY_CARE_PROVIDER_SITE_OTHER): Payer: 59 | Admitting: Podiatry

## 2022-09-02 DIAGNOSIS — Z91199 Patient's noncompliance with other medical treatment and regimen due to unspecified reason: Secondary | ICD-10-CM

## 2022-09-02 NOTE — Progress Notes (Signed)
No show

## 2022-11-20 ENCOUNTER — Ambulatory Visit: Payer: 59 | Admitting: Podiatry

## 2022-11-20 ENCOUNTER — Encounter: Payer: Self-pay | Admitting: Podiatry

## 2022-11-20 DIAGNOSIS — M79675 Pain in left toe(s): Secondary | ICD-10-CM

## 2022-11-20 DIAGNOSIS — Q828 Other specified congenital malformations of skin: Secondary | ICD-10-CM | POA: Diagnosis not present

## 2022-11-20 DIAGNOSIS — B351 Tinea unguium: Secondary | ICD-10-CM

## 2022-11-20 DIAGNOSIS — M79674 Pain in right toe(s): Secondary | ICD-10-CM

## 2022-11-20 NOTE — Progress Notes (Signed)
  Subjective:  Patient ID: Andrew Mcbride, male    DOB: 05-18-1970,   MRN: [id number]  Chief Complaint  Patient presents with   Nail Problem    RFC.    52 y.o. male presents for concern of thickened elongated and painful nails that are difficult to trim. Requesting to have them trimmed today. Denies  burning and tingling in their feet. Patient is not diabetic.   PCP:  [name], MD    . Denies any other pedal complaints. Denies n/v/f/c.   Past Medical History:  Diagnosis Date   Anxiety    Chest pain    Chronic back pain    Chronic leg pain    left   Palpitation     Objective:  Physical Exam: Vascular: DP/PT pulses 2/4 bilateral. CFT <3 seconds. Absent hair growth on digits. Edema noted to bilateral lower extremities. Xerosis noted bilaterally.  Skin. No lacerations or abrasions bilateral feet. Nails 1-5 bilateral  are thickened discolored and elongated with subungual debris. Hyperkeratotic cored lesion noted sub fifth metatarsal on  bilateral feet.  Musculoskeletal: MMT 5/5 bilateral lower extremities in DF, PF, Inversion and Eversion. Deceased ROM in DF of ankle joint. Plantar flexed first metatarsal.  Neurological: Sensation intact to light touch. Protective sensation diminished bilateral.    Assessment:   1. Pain due to onychomycosis of toenails of both feet   2. Porokeratosis        Plan:  Patient was evaluated and treated and all questions answered. -Discussed and educated patient on diabetic foot care, especially with  regards to the vascular, neurological and musculoskeletal systems.  -Stressed the importance of good glycemic control and the detriment of not  controlling glucose levels in relation to the foot. -Discussed supportive shoes at all times and checking feet regularly.  -Mechanically debrided all nails 1-5 bilateral using sterile nail nipper and filed with dremel without incident  -Hyperkeratotic tissue debrided without incident.  -Did discuss  surgery to help with the calluses areas on feet due to his plantarflexed metatarsals. Discussed perioperative course. 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  -Answered all patient questions -Patient to return  in 9 weeks for at risk foot care -Patient advised to call the office if any problems or questions arise in the meantime.   Louann Sjogren, DPM

## 2023-02-24 ENCOUNTER — Ambulatory Visit (INDEPENDENT_AMBULATORY_CARE_PROVIDER_SITE_OTHER): Payer: 59 | Admitting: Podiatry

## 2023-02-24 DIAGNOSIS — Z91199 Patient's noncompliance with other medical treatment and regimen due to unspecified reason: Secondary | ICD-10-CM

## 2023-02-24 NOTE — Progress Notes (Signed)
 No show

## 2023-03-18 ENCOUNTER — Encounter: Payer: Self-pay | Admitting: Podiatry

## 2023-03-18 ENCOUNTER — Ambulatory Visit: Payer: 59 | Admitting: Podiatry

## 2023-03-18 DIAGNOSIS — M79675 Pain in left toe(s): Secondary | ICD-10-CM

## 2023-03-18 DIAGNOSIS — M79674 Pain in right toe(s): Secondary | ICD-10-CM

## 2023-03-18 DIAGNOSIS — B351 Tinea unguium: Secondary | ICD-10-CM

## 2023-03-18 DIAGNOSIS — Q828 Other specified congenital malformations of skin: Secondary | ICD-10-CM | POA: Diagnosis not present

## 2023-03-18 NOTE — Progress Notes (Signed)
  Subjective:  Patient ID: Andrew Mcbride, male    DOB: 06-21-1970,   MRN: [id number]  No chief complaint on file.   53 y.o. male presents for concern of thickened elongated and painful nails that are difficult to trim. Requesting to have them trimmed today. Also relates painful calluses that are the biggest problem. Denies  burning and tingling in their feet. Patient is not diabetic.   PCP:  [name], MD    . Denies any other pedal complaints. Denies n/v/f/c.   Past Medical History:  Diagnosis Date   Anxiety    Chest pain    Chronic back pain    Chronic leg pain    left   Palpitation     Objective:  Physical Exam: Vascular: DP/PT pulses 2/4 bilateral. CFT <3 seconds. Absent hair growth on digits. Edema noted to bilateral lower extremities. Xerosis noted bilaterally.  Skin. No lacerations or abrasions bilateral feet. Nails 1-5 bilateral  are thickened discolored and elongated with subungual debris. Hyperkeratotic cored lesion noted sub fifth metatarsal on  bilateral feet.  Musculoskeletal: MMT 5/5 bilateral lower extremities in DF, PF, Inversion and Eversion. Deceased ROM in DF of ankle joint. Plantar flexed first metatarsal.  Neurological: Sensation intact to light touch. Protective sensation diminished bilateral.    Assessment:   1. Pain due to onychomycosis of toenails of both feet   2. Porokeratosis        Plan:  Patient was evaluated and treated and all questions answered. -Discussed and educated patient on diabetic foot care, especially with  regards to the vascular, neurological and musculoskeletal systems.  -Stressed the importance of good glycemic control and the detriment of not  controlling glucose levels in relation to the foot. -Discussed supportive shoes at all times and checking feet regularly.  -Mechanically debrided all nails 1-5 bilateral using sterile nail nipper and filed with dremel without incident  -Hyperkeratotic tissue debrided without  incident.  -Did discuss surgery to help with the calluses areas on feet due to his plantarflexed metatarsals. Discussed perioperative course. 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 His mom is also sick now and taking care of her.  -Answered all patient questions -Patient to return  in 9 weeks for at risk foot care -Patient advised to call the office if any problems or questions arise in the meantime.   Louann Sjogren, DPM

## 2023-05-16 ENCOUNTER — Other Ambulatory Visit: Payer: Self-pay

## 2023-05-16 ENCOUNTER — Emergency Department (HOSPITAL_BASED_OUTPATIENT_CLINIC_OR_DEPARTMENT_OTHER)
Admission: EM | Admit: 2023-05-16 | Discharge: 2023-05-16 | Disposition: A | Discharge status: Home / Self Care | Attending: Emergency Medicine | Admitting: Emergency Medicine

## 2023-05-16 ENCOUNTER — Encounter (HOSPITAL_BASED_OUTPATIENT_CLINIC_OR_DEPARTMENT_OTHER): Payer: Self-pay

## 2023-05-16 DIAGNOSIS — M549 Dorsalgia, unspecified: Secondary | ICD-10-CM | POA: Insufficient documentation

## 2023-05-16 DIAGNOSIS — Y92410 Unspecified street and highway as the place of occurrence of the external cause: Secondary | ICD-10-CM | POA: Insufficient documentation

## 2023-05-16 MED ORDER — IBUPROFEN 800 MG PO TABS
800.0000 mg | ORAL_TABLET | Freq: Once | ORAL | Status: AC
  Administered 2023-05-16: 800 mg via ORAL
  Filled 2023-05-16: qty 1

## 2023-05-16 MED ORDER — ACETAMINOPHEN 500 MG PO TABS
1000.0000 mg | ORAL_TABLET | Freq: Once | ORAL | Status: AC
  Administered 2023-05-16: 1000 mg via ORAL
  Filled 2023-05-16: qty 2

## 2023-05-16 MED ORDER — METHOCARBAMOL 500 MG PO TABS: 500.0000 mg | ORAL_TABLET | Freq: Two times a day (BID) | ORAL | 0 refills | Status: AC

## 2023-05-16 NOTE — Discharge Instructions (Addendum)
 Take 4 over the counter ibuprofen  tablets 3 times a day or 2 over-the-counter naproxen  tablets twice a day for pain. Also take tylenol  1000mg (2 extra strength) four times a day.   Your pain typically will get better over the course of the week.  If you are still having ongoing symptoms please let your family doctor know and they can choose to image at that time.
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
## 2023-05-28 ENCOUNTER — Ambulatory Visit (INDEPENDENT_AMBULATORY_CARE_PROVIDER_SITE_OTHER): Payer: Self-pay | Admitting: Podiatry

## 2023-05-28 DIAGNOSIS — Z91199 Patient's noncompliance with other medical treatment and regimen due to unspecified reason: Secondary | ICD-10-CM

## 2023-05-28 NOTE — Progress Notes (Signed)
 1. No-show for appointment

## 2023-06-04 ENCOUNTER — Ambulatory Visit (INDEPENDENT_AMBULATORY_CARE_PROVIDER_SITE_OTHER): Payer: Self-pay | Admitting: Podiatry

## 2023-06-04 DIAGNOSIS — Z91199 Patient's noncompliance with other medical treatment and regimen due to unspecified reason: Secondary | ICD-10-CM

## 2023-06-04 NOTE — Progress Notes (Signed)
 1. Failure to attend appointment without reason given   No insurance information. Will call back to reschedule.

## 2023-07-29 ENCOUNTER — Ambulatory Visit (INDEPENDENT_AMBULATORY_CARE_PROVIDER_SITE_OTHER): Payer: Self-pay | Admitting: Podiatry

## 2023-07-29 DIAGNOSIS — Z91198 Patient's noncompliance with other medical treatment and regimen for other reason: Secondary | ICD-10-CM

## 2023-07-29 NOTE — Progress Notes (Signed)
 1. Failure to attend appointment with reason given    Patient rescheduled appointment.

## 2023-11-04 ENCOUNTER — Ambulatory Visit (INDEPENDENT_AMBULATORY_CARE_PROVIDER_SITE_OTHER): Payer: Self-pay | Admitting: Podiatry

## 2023-11-04 DIAGNOSIS — Z91199 Patient's noncompliance with other medical treatment and regimen due to unspecified reason: Secondary | ICD-10-CM

## 2023-11-09 NOTE — Progress Notes (Signed)
 1. No-show for appointment
